# Patient Record
Sex: Male | Born: 1945 | Race: White | Hispanic: No | Marital: Married
Health system: Midwestern US, Community
[De-identification: ages and names within clinical notes are randomized; demographics above are authoritative.]

## PROBLEM LIST (undated history)

## (undated) DIAGNOSIS — N400 Enlarged prostate without lower urinary tract symptoms: Secondary | ICD-10-CM

## (undated) DIAGNOSIS — Z006 Encounter for examination for normal comparison and control in clinical research program: Secondary | ICD-10-CM

## (undated) DIAGNOSIS — N401 Enlarged prostate with lower urinary tract symptoms: Secondary | ICD-10-CM

## (undated) DIAGNOSIS — C61 Malignant neoplasm of prostate: Secondary | ICD-10-CM

## (undated) DIAGNOSIS — R001 Bradycardia, unspecified: Secondary | ICD-10-CM

## (undated) DIAGNOSIS — K52831 Collagenous colitis: Secondary | ICD-10-CM

## (undated) DIAGNOSIS — G4700 Insomnia, unspecified: Secondary | ICD-10-CM

## (undated) DIAGNOSIS — I10 Essential (primary) hypertension: Secondary | ICD-10-CM

## (undated) DIAGNOSIS — T8859XA Other complications of anesthesia, initial encounter: Secondary | ICD-10-CM

## (undated) HISTORY — DX: Essential (primary) hypertension: I10

## (undated) HISTORY — DX: Insomnia, unspecified: G47.00

## (undated) HISTORY — DX: Collagenous colitis: K52.831

## (undated) HISTORY — DX: Bradycardia, unspecified: R00.1

## (undated) HISTORY — DX: Malignant neoplasm of prostate: C61

## (undated) HISTORY — PX: KNEE ARTHROSCOPY: SUR90

## (undated) HISTORY — PX: SHOULDER ARTHROSCOPY: SHX128

## (undated) HISTORY — PX: HIP SURGERY: SHX245

---

## 2011-11-23 LAB — VITAMIN B12: Vitamin B-12: 637 ng/L (ref 211–911)

## 2011-11-23 LAB — TESTOSTERONE, FREE
Sex Hormone Binding: 49 nmol/L (ref 11–80)
Testosterone, Free: 79 pg/mL (ref 47–244)
Testosterone: 489 ng/dL

## 2011-12-23 LAB — SURGICAL PATHOLOGY

## 2012-06-16 LAB — PSA, DIAGNOSTIC: PSA: 3.58 ug/L (ref 0.13–4.00)

## 2012-06-23 NOTE — Progress Notes (Signed)
PSA 3.6  Last 3.9    No sxs    Exam:  2+ soft, smooth.    PSA 6    This office note has been dictated.

## 2012-07-24 MED ORDER — VALACYCLOVIR HCL 1 G PO TABS
1 g | ORAL_TABLET | Freq: Two times a day (BID) | ORAL | Status: AC
Start: 2012-07-24 — End: 2012-07-25

## 2012-07-24 MED ORDER — CHLORHEXIDINE GLUCONATE 0.12 % MT SOLN
0.12 % | Freq: Two times a day (BID) | OROMUCOSAL | Status: AC
Start: 2012-07-24 — End: 2012-08-07

## 2012-07-24 NOTE — Progress Notes (Signed)
Subjective:      Patient ID: Jay Lewis is a 66 y.o. male.    Oral Pain   Pertinent negatives include no sinus pressure.    acute visit for tongue pain, lesion starting about 2 days after dental extraction about a week  Ago.  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    Review of Systems   Constitutional: Negative.    HENT: Positive for mouth sores. Negative for sore throat, rhinorrhea, sneezing, trouble swallowing, neck pain, postnasal drip and sinus pressure.    Eyes: Negative.    Respiratory: Negative.    Cardiovascular: Negative.    Gastrointestinal: Negative.    Musculoskeletal: Negative.    Skin: Negative.    Allergic/Immunologic: Negative.    Neurological: Negative.    Hematological: Negative.    Psychiatric/Behavioral: Negative.        Objective:   Physical Exam   Constitutional: He is oriented to person, place, and time. He appears well-developed and well-nourished. No distress.   HENT:   Head: Normocephalic and atraumatic.   Right Ear: Right ear foreign body: cerumen plug.   Left Ear: External ear normal.   Nose: Nose normal. No mucosal edema or rhinorrhea.   Mouth/Throat: Uvula is midline. Oral lesions present.       Eyes: Conjunctivae are normal. Pupils are equal, round, and reactive to light.   Neck: Neck supple. No thyromegaly present.   Cardiovascular: Normal rate, regular rhythm and intact distal pulses.    No murmur heard.  Pulmonary/Chest: Effort normal and breath sounds normal. No respiratory distress. He has no wheezes.   Abdominal: Bowel sounds are normal. He exhibits no distension. There is no tenderness.   Musculoskeletal: Normal range of motion. He exhibits no edema.   Lymphadenopathy:     He has no cervical adenopathy.   Neurological: He is alert and oriented to person, place, and time.   Skin: Skin is dry.   Psychiatric: He has a normal mood and affect. His behavior is normal.     BP 164/98   Pulse 80   Wt 223 lb 6.4 oz (101.334 kg)   BMI 30.09 kg/m2    Assessment:      aphthous ulcers-viral       Plan:      Valtrex 2gm q 12hr x 1 day  peridex oral rinse prn  F/u prn

## 2012-11-30 MED ORDER — AMITRIPTYLINE HCL 50 MG PO TABS
50 MG | ORAL_TABLET | ORAL | Status: DC
Start: 2012-11-30 — End: 2013-11-23

## 2012-11-30 NOTE — Telephone Encounter (Signed)
Not seen in [place] since [facility]

## 2012-12-01 NOTE — Telephone Encounter (Signed)
LM to call.

## 2012-12-17 DIAGNOSIS — N400 Enlarged prostate without lower urinary tract symptoms: Secondary | ICD-10-CM | POA: Insufficient documentation

## 2012-12-17 LAB — MICROSCOPIC URINALYSIS
Epithelial Cells UA: 0 /HPF (ref 0–4)
RBC, UA: 0 /HPF (ref 0–4)
WBC, UA: 0 /HPF (ref 0–4)

## 2012-12-17 LAB — UA W/REFLEX CULTURE
Bilirubin Urine: NEGATIVE
Glucose, Ur: NEGATIVE
Ketones, Urine: NEGATIVE
Nitrite, Urine: NEGATIVE
Protein, UA: NEGATIVE
Specific Gravity, UA: 1.01 (ref 1.010–1.025)
Urine Hgb: NEGATIVE
Urobilinogen, Urine: NORMAL
pH, UA: 7 — ABNORMAL HIGH (ref 5.0–6.0)

## 2012-12-17 LAB — CBC WITH AUTO DIFFERENTIAL
Absolute Eos #: 0.7 10*3/uL — ABNORMAL HIGH (ref 0.0–0.4)
Absolute Lymph #: 2.1 10*3/uL (ref 1.0–4.8)
Absolute Mono #: 0.7 10*3/uL (ref 0.1–1.2)
Basophils Absolute: 0.1 10*3/uL (ref 0.0–0.2)
Basophils: 1 % (ref 0–2)
Eosinophils %: 9 % — ABNORMAL HIGH (ref 1–4)
Hematocrit: 43.2 % (ref 41–53)
Hemoglobin: 14.4 g/dL (ref 13.5–17.5)
Lymphocytes: 27 % (ref 24–44)
MCH: 30.2 pg (ref 26–34)
MCHC: 33.3 g/dL (ref 31–37)
MCV: 90.6 fL (ref 80–100)
MPV: 9.1 fL (ref 6.0–12.0)
Monocytes: 9 % — ABNORMAL HIGH (ref 1–7)
Platelets: 183 10*3/uL (ref 140–450)
RBC: 4.77 m/uL (ref 4.5–5.9)
RDW: 13.2 % (ref 11.0–14.5)
Seg Neutrophils: 54 % (ref 36–66)
Segs Absolute: 4.1 10*3/uL (ref 1.8–7.7)
WBC: 7.7 10*3/uL (ref 3.5–11.0)

## 2012-12-17 LAB — BASIC METABOLIC PANEL
BUN: 18 mg/dL (ref 8–23)
Bun/Cre Ratio: 17 (ref 9–20)
CO2: 28 mmol/L (ref 20–31)
Calcium: 9.3 mg/dL (ref 8.6–10.0)
Chloride: 99 mmol/L (ref 98–107)
Creatinine: 1.05 mg/dL (ref 0.70–1.20)
GFR African American: >60 mL/min (ref >60)
GFR Non-African American: >60 mL/min (ref >60)
Glucose: 96 mg/dL (ref 70–99)
Potassium: 4.4 mmol/L (ref 3.5–5.1)
Sodium: 135 mmol/L — ABNORMAL LOW (ref 136–145)

## 2012-12-17 MED ORDER — LISINOPRIL 10 MG PO TABS
10 MG | ORAL_TABLET | Freq: Every day | ORAL | Status: DC
Start: 2012-12-17 — End: 2015-09-15

## 2012-12-17 NOTE — Progress Notes (Signed)
Subjective:      Patient ID: Jay Lewis is a 67 y.o. male.    HPI Comments: Needs refill of elavil for sleep problems.  Has seen doctor walsh for prostate issues.  Blood pressure has monitored at home and consistently at 140.  Has been exercising three days a week.  Does have knee pain when walks is going to be biking.  Tremor unchanged.      Review of Systems   Constitutional: Negative.    HENT: Negative.    Eyes: Negative.    Respiratory: Negative.    Cardiovascular: Negative.    Gastrointestinal: Negative.    Endocrine: Negative.    Genitourinary: Positive for decreased urine volume.   Musculoskeletal: Positive for arthralgias.   Skin: Negative.         Cracked skin on hands   Allergic/Immunologic: Negative.    Neurological: Positive for tremors.   Hematological: Negative.    Psychiatric/Behavioral: Negative.        Objective:   Physical Exam   Nursing note and vitals reviewed.  Constitutional: He is oriented to person, place, and time. He appears well-developed and well-nourished. No distress.   HENT:   Head: Normocephalic and atraumatic.   Right Ear: External ear normal.   Left Ear: External ear normal.   Nose: Nose normal.   Mouth/Throat: Oropharynx is clear and moist. No oropharyngeal exudate.   Eyes: Conjunctivae are normal. Pupils are equal, round, and reactive to light. Right eye exhibits no discharge. Left eye exhibits no discharge. No scleral icterus.   Neck: No tracheal deviation present. No thyromegaly present.   Cardiovascular: Normal rate, regular rhythm, normal heart sounds and intact distal pulses.    No murmur heard.  Pulmonary/Chest: Effort normal and breath sounds normal. He has no wheezes. He has no rales.   Abdominal: Soft. Bowel sounds are normal. He exhibits no mass. There is no tenderness.   Musculoskeletal: He exhibits no edema.   No joint effusions   Lymphadenopathy:     He has no cervical adenopathy.   Neurological: He is alert and oriented to person, place, and time. He displays  tremor (slight tremor). No cranial nerve deficit.   No cog wheeling    Skin: Skin is warm and dry. He is not diaphoretic.   Psychiatric: He has a normal mood and affect.     Thickened skin on index finger with scaliness  Assessment:      Hypertension  BPH  Lesion finger  Insomnia  Tremor  Knee pain and arthritis  Hyperlipidemia          Plan:      Will start lisinopril 10 mg a day with typical orders. Discussed tetanus and zostivax to look into.  Labs per orders.  Continue fitness and diet.  Does not want to pursue cholesterol issues.  Follow up prn and per routine health care needs. To have blood pressure monitored at home if above 135/85 to recheck

## 2012-12-17 NOTE — Patient Instructions (Signed)
Look into tetanus and shingle shot    Keep active and improve diet with weight loss

## 2012-12-18 NOTE — Telephone Encounter (Signed)
Lm to call re 4/3 labs

## 2012-12-18 NOTE — Telephone Encounter (Signed)
Message copied by Olena Leatherwood on Fri Dec 18, 2012  8:52 AM  ------       Message from: Leonie Man       Created: Thu Dec 17, 2012  2:42 PM         Labs look good  ------

## 2012-12-18 NOTE — Telephone Encounter (Signed)
Message copied by Olena Leatherwood on Fri Dec 18, 2012  8:56 AM  ------       Message from: Leonie Man       Created: Thu Dec 17, 2012  2:42 PM         Labs look good  ------

## 2012-12-22 LAB — PSA, DIAGNOSTIC: PSA: 3.26 ug/L (ref <4.1)

## 2012-12-22 NOTE — Progress Notes (Signed)
Just got PSA now.    Last 3.6    Exam: 2+ soft, smooth    PSA 6 unless current much higher    This office note has been dictated.

## 2013-01-05 NOTE — Telephone Encounter (Signed)
Patient was called again today/he had already viewed his labs in his my chart when they were available to him

## 2013-02-25 MED ORDER — TERBINAFINE HCL 250 MG PO TABS
250 MG | ORAL_TABLET | Freq: Every day | ORAL | Status: AC
Start: 2013-02-25 — End: 2013-03-27

## 2013-02-25 MED ORDER — LOSARTAN POTASSIUM 50 MG PO TABS
50 MG | ORAL_TABLET | Freq: Every day | ORAL | Status: DC
Start: 2013-02-25 — End: 2015-09-15

## 2013-02-25 MED ORDER — CLOBETASOL PROPIONATE 0.05 % EX OINT
0.05 % | CUTANEOUS | Status: DC
Start: 2013-02-25 — End: 2015-09-15

## 2013-02-25 NOTE — Patient Instructions (Addendum)
Stop lisinopril   lamisil cream  Toenail Fungus: After Your Visit  Your Care Instructions  A toenail that is infected by a fungus usually turns white or yellow. As the fungus spreads, the nail turns a darker color and gets thicker, and its edges start to turn ragged and crumble. A bad infection can cause toe pain, and the nail may pull away from the toe.  Toenails that are exposed to moisture and warmth a lot are more likely to get infected by a fungus. This can happen from wearing sweaty shoes often and from walking barefoot on shower floors.  It is hard to treat toenail fungus, and the infection can return after it has cleared up. But medicines can sometimes get rid of toenail fungus for good. If the infection is very bad, or if it causes a lot of pain, you may need to have the nail removed.  Follow-up care is a key part of your treatment and safety. Be sure to make and go to all appointments, and call your doctor if you are having problems. It???s also a good idea to know your test results and keep a list of the medicines you take.  How can you care for yourself at home?  ?? Take your medicines exactly as prescribed. Call your doctor if you have any problems with your medicine. You will get more details on the specific medicines your doctor prescribes.  ?? If your doctor gave you a cream or liquid to put on your toenail, use it exactly as directed.  ?? Wash your feet often, and wash your hands after touching your feet.  ?? Keep your toenails clean and dry. Dry your feet completely after you bathe and before you put on shoes and socks.  ?? Keep your toenails trimmed.  ?? Change socks often. Wear dry socks that absorb moisture.  ?? Do not go barefoot in public places.  ?? Use a spray or powder that fights fungus on your feet and in your shoes.  ?? Do not pick at the skin around your nails.  ?? Do not use nail polish or fake nails on your toenails.  When should you call for help?  Call your doctor now or seek immediate medical  care if:  ?? You have signs of infection, such as:  ?? Increased pain, swelling, warmth, or redness.  ?? Red streaks leading from the site.  ?? Pus draining from the site.  ?? A fever.  ?? You have new or increased toe pain.  Watch closely for changes in your health, and be sure to contact your doctor if:  ?? You do not get better as expected.   Where can you learn more?   Go to [email or web address] and sign in to your MyChart account. Enter D202 in the Search Health Information box to learn more about ???Toenail Fungus: After Your Visit.???    If you do not have an account, please click on the ???Sign Up Now??? link.     ?? 2006-2014 [employer]. Care instructions adapted under license by [facility]. This care instruction is for use with your licensed healthcare professional. If you have questions about a medical condition or this instruction, always ask your healthcare professional. [employer] disclaims any warranty or liability for your use of this information.  Content Version: 10.0.273164; Last Revised: July 31, 2012

## 2013-02-25 NOTE — Progress Notes (Signed)
Subjective:      Patient ID: Jay Lewis is a 67 y.o. male.    HPI Comments: Has rash on feet that did not respond to over counter antifungal.  Does have mycotic nails  Cough started after lisinopril started with classical presentation to ace induced cough    Cough  Pertinent negatives include no shortness of breath.       Review of Systems   HENT: Negative.    Eyes: Negative.    Respiratory: Positive for cough. Negative for shortness of breath.    Cardiovascular: Negative.    Gastrointestinal: Negative.        Objective:   Physical Exam   Nursing note and vitals reviewed.  Constitutional: He is oriented to person, place, and time. He appears well-developed and well-nourished.   Cardiovascular: Normal rate and regular rhythm.    Pulmonary/Chest: Effort normal and breath sounds normal.   Abdominal: Soft.   Musculoskeletal: He exhibits no edema.   Neurological: He is alert and oriented to person, place, and time.   Skin:   Has mycotic nails and tinea pedis  No evidence of bacterial infection       Assessment:      Tinea infection feet with mycotic nails  Cough ace induced  Hypertension        Plan:      Will stop lisinopril and start cozaar 50 mg a day.  Discussed option and care of fungus feet.  Will start lamisil and topical with prn steroid cream.

## 2013-06-22 LAB — PSA, DIAGNOSTIC: PSA: 4 ug/L (ref <4.1)

## 2013-06-25 NOTE — Progress Notes (Signed)
No sxs    PSA 4.0  Last 3.6  Range 3-4    Exam:  Prostate 2+ soft, smooth.    PSA 6    This office note has been dictated.

## 2013-11-23 MED ORDER — AMITRIPTYLINE HCL 50 MG PO TABS
50 MG | ORAL_TABLET | Freq: Every evening | ORAL | Status: AC
Start: 2013-11-23 — End: ?

## 2013-11-23 NOTE — Telephone Encounter (Signed)
02/25/13 last OV

## 2013-12-17 LAB — PSA, DIAGNOSTIC: PSA: 3.15 ug/L (ref <4.1)

## 2014-01-14 NOTE — Progress Notes (Signed)
No sxs    PSA 3.2  Last 4    Range 3-4    Exam:  2+ soft, smooth    PSA 1 year    This office note has been dictated.

## 2015-01-13 ENCOUNTER — Encounter: Admit: 2015-01-13 | Discharge: 2015-01-13 | Payer: MEDICARE | Primary: Internal Medicine

## 2015-01-13 DIAGNOSIS — N400 Enlarged prostate without lower urinary tract symptoms: Secondary | ICD-10-CM

## 2015-01-13 LAB — PSA, DIAGNOSTIC: PSA: 3.19 ug/L (ref <4.1)

## 2015-01-20 ENCOUNTER — Ambulatory Visit: Admit: 2015-01-20 | Discharge: 2015-01-20 | Payer: MEDICARE | Attending: Urology | Primary: Internal Medicine

## 2015-01-20 DIAGNOSIS — Z125 Encounter for screening for malignant neoplasm of prostate: Secondary | ICD-10-CM

## 2015-01-20 NOTE — Progress Notes (Signed)
PSA 3.2 Last 3.6    No sxs    Exam: 2+ soft, smooth.    PSA 6    This office note has been dictated.

## 2015-09-15 ENCOUNTER — Ambulatory Visit: Admit: 2015-09-15 | Payer: MEDICARE | Primary: Internal Medicine

## 2015-09-15 ENCOUNTER — Ambulatory Visit: Admit: 2015-09-15 | Discharge: 2015-09-15 | Payer: MEDICARE | Attending: Family | Primary: Internal Medicine

## 2015-09-15 DIAGNOSIS — M25511 Pain in right shoulder: Secondary | ICD-10-CM

## 2015-09-15 MED ORDER — HYDROCODONE-ACETAMINOPHEN 5-325 MG PO TABS
5-325 MG | ORAL_TABLET | Freq: Three times a day (TID) | ORAL | 0 refills | Status: AC | PRN
Start: 2015-09-15 — End: 2015-09-22

## 2015-09-15 NOTE — Progress Notes (Signed)
Subjective:      Patient ID: Jay Lewis is a 69 y.o. male.    Shoulder Pain    The pain is present in the right shoulder. This is a new problem. The current episode started yesterday. There has been a history of trauma. The problem occurs constantly. The problem has been unchanged. The quality of the pain is described as aching. The pain is at a severity of 7/10. The pain is moderate. Associated symptoms include a limited range of motion and stiffness. Pertinent negatives include no fever, itching, joint locking, joint swelling, numbness or tingling. The symptoms are aggravated by activity. He has tried acetaminophen, NSAIDS, heat and cold for the symptoms. The treatment provided mild relief.       Review of Systems   Constitutional: Negative for activity change, appetite change and fever.   Respiratory: Negative for cough, shortness of breath and wheezing.    Cardiovascular: Negative for chest pain and palpitations.   Musculoskeletal: Positive for stiffness.        Right shoulder pain   Skin: Negative.  Negative for itching.   Neurological: Negative for tingling and numbness.       Objective:   Physical Exam   Constitutional: He is oriented to person, place, and time. He appears well-developed and well-nourished. No distress.   HENT:   Head: Normocephalic and atraumatic.   Eyes: Pupils are equal, round, and reactive to light.   Neck: Neck supple.   Cardiovascular: Normal rate, regular rhythm and normal heart sounds.    Pulmonary/Chest: Effort normal and breath sounds normal.   Musculoskeletal:        Right shoulder: He exhibits decreased range of motion, tenderness, pain and decreased strength. He exhibits normal pulse.        Arms:  Positive speed's test, positive hawkins test.    Neurological: He is alert and oriented to person, place, and time.   Nursing note and vitals reviewed.    Xr Shoulder Right Standard    Result Date: 09/15/2015  EXAMINATION: 3 VIEWS OF THE RIGHT SHOULDER 09/15/2015 2:09 pm  COMPARISON: None. 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  Mild osteoarthritis of the glenohumeral joint with mild narrowing of the joint space.  No acute fracture, dislocation or focal osseous abnormality.  No soft tissue calcifications.     Osteoarthritis.       Assessment:      1. Acute pain of right shoulder  XR Shoulder Right Standard    External Referral To Orthopedic Surgery   2. Fall, initial encounter             Plan:         discussed xray results with patient   Concern for rotator injury  Will refer to Dr. Mel Almond for evaluation  Will give patient norco 5-325mg  1 tablet every 8 hours as needed  Pt to return if symptoms do not improve or worsen   Return PRN    Controlled Substances Monitoring: Attestation: The Prescription Monitoring Report for this patient was reviewed today. Sumner Boast, NP)  Documentation: No signs of potential drug abuse or diversion identified. Sumner Boast, NP)

## 2015-09-19 ENCOUNTER — Ambulatory Visit: Admit: 2015-09-19 | Discharge: 2015-09-19 | Payer: MEDICARE | Attending: Surgical | Primary: Internal Medicine

## 2015-09-19 DIAGNOSIS — M75101 Unspecified rotator cuff tear or rupture of right shoulder, not specified as traumatic: Secondary | ICD-10-CM

## 2015-09-19 NOTE — Progress Notes (Signed)
Please see dictation

## 2015-09-22 NOTE — Telephone Encounter (Signed)
Was seen for rotator cuff and forgot to ask for more pain meds when he was here, is now out of Norco 5-325mg . Told patient we could check on Monday to see if can get this filled and pt stated understanding. Also told him he could check with his family doctor as well to see if they could prescribe more until we can check on Monday. He said okay.

## 2015-09-26 NOTE — Telephone Encounter (Signed)
Per Gustavus Bryant, we can call in Naprosyn 375mg  BID- Patient stated that he cannot take any NSAIDS due to colitis. Aaron Edelman does not feel comfortable prescribing narcotics for long term- patient stated that he will discuss narcotics with PCP.

## 2015-10-18 ENCOUNTER — Ambulatory Visit: Admit: 2015-10-18 | Discharge: 2015-10-18 | Payer: MEDICARE | Attending: Urology | Primary: Internal Medicine

## 2015-10-18 DIAGNOSIS — R3915 Urgency of urination: Secondary | ICD-10-CM

## 2015-10-18 LAB — POCT URINALYSIS DIPSTICK W/O MICROSCOPE (AUTO)
Bilirubin, UA: NEGATIVE
Blood, UA POC: NEGATIVE
Glucose, UA POC: NEGATIVE
Ketones, UA: NEGATIVE
Leukocytes, UA: NEGATIVE
Nitrite, UA: NEGATIVE
Protein, UA POC: NEGATIVE
Spec Grav, UA: >=1.03
Urobilinogen, UA: 0.2
pH, UA: 5

## 2015-10-18 MED ORDER — SILDENAFIL CITRATE 100 MG PO TABS
100 | ORAL_TABLET | ORAL | 3 refills | Status: AC | PRN
Start: 2015-10-18 — End: ?

## 2015-10-18 NOTE — Patient Instructions (Signed)
UROFLOW INSTRUCTIONS    1. Drink at least eight (8) glasses containing eight (8) ounces of fluid before  appointment.    2. DO NOT URINATE    3. When arriving at clinic, let receptionist know that you are here.    4. If you cannot hold your urine, please inform the receptionist and we will put you  in a room at once.    5. You will be urinating into a machine at the time of the uroflow.

## 2015-10-18 NOTE — Progress Notes (Signed)
HPI:    Erectile Dysfunction:  Patient is here today for erectile dysfunction which is intermitet  Recently his ED symptoms: somewhat worseHis erections are weak  Difficulty maintaining erection? no  Current medical Rx for ED: none  Previous treatments tried for ED: none  Penile curvature: no  Premature Ejaculation? No  Sex drive/libido: normal  Previous testosterone level? not examined     Urgency  Patient is here today forurgency which started 6 week(s) ago  Symptom onset was sudden  Severity of dysuria described as moderate  The course of symptoms over time is episodic. Daytime only  Alleviating factors: none  Worsening factors: none  Current medical Rx for dysuria: none    Lower urinary tract symptoms: urgency  Recurrent urinary tract infections? no  HX borderline PSA    Due again in May              Patient is a 70 y.o. male in no acute distress.  He is alert and oriented to person, place, and time.      ROS:  CONSTITUTIONAL: negative for chills,fatigue,fever or weight loss.  EYES: no reported visual changes.  CARDIOVASCULAR: negative for chest pain,palpitations, tachycardia or edema.  RESPIRATORY: negative for cough or SOB.  GI:The patient denies abdominal or flank pain, anorexia, nausea or vomiting, change in          bowel habits or rectal bleeding.  GU: see HPI.  MUSCULOSKELETAL: Patient denies low back pain or painful or reduced range of                                                 motion of the back.  NEUROLOGICAL: The patient denies any symptoms of neurological impairment or                                         TIA's; no history of stroke.  LYMPHATIC: has noted no swollen glands in neck, axillary or inguinal areas.  PSYCHIATRIC: No anxiety or depression.  SKIN: No rash  The remainder of the complete ROS is negative.    PHYSICAL EXAM:        CONSTITUTIONAL: General appearance is a well nourished, well developed male in no distress.        EYES:. Pupils are equal, round and react to light and  accommodation.  Inspection of the lids and conjunctiva are normal.        NOSE: The external appearance of the nose is normal        EARS:  The patient has adequate hearing, and the ears appear normal to external inspection.        NECK:  The trachea is midline                MUSCULOSKELETAL: normal gait        NEUROLOGIC: There is normal sensation to touch        LYMPHATICS: I do not appreciate any lymphadenopathy        SKIN: there are no obvious rashes noted        PSYCHIATRIC:  The patient is oriented in all spheres and has good insight and judgement as related to his current health issues.  He has normal mood and affect.  Past Medical History   Diagnosis Date   ??? Actinic keratosis    ??? Anemia, mild    ??? Anxiety    ??? Chondromalacia    ??? Collagenous colitis    ??? Depression    ??? Diverticulosis    ??? Elevated blood pressure    ??? GERD (gastroesophageal reflux disease)    ??? Head trauma    ??? Hyperlipidemia    ??? Insomnia    ??? Osteoarthritis    ??? Right hip pain    ??? Tachycardia      history of   ??? Tremor      with right-sided weakness     Past Surgical History   Procedure Laterality Date   ??? Finger surgery       thumb   ??? Colonoscopy  05/24/2011     diverticulosis/hemorrhoid   ??? Prostate surgery  12/17/2011     biopsy   ??? Colonoscopy  2016     Colitis     Family History   Problem Relation Age of Onset   ??? Stroke Father    ??? Heart Disease Father      Current Outpatient Prescriptions on File Prior to Visit   Medication Sig Dispense Refill   ??? Glucosamine-Chondroitin (GLUCOSAMINE CHONDR COMPLEX PO) Take by mouth daily     ??? zolpidem (AMBIEN) 10 MG tablet Take 5 mg by mouth nightly as needed     ??? amitriptyline (ELAVIL) 50 MG tablet Take 1 tablet by mouth nightly. 90 tablet 0     No current facility-administered medications on file prior to visit.       Outpatient Encounter Prescriptions as of 10/18/2015   Medication Sig Dispense Refill   ??? loperamide (LOPERAMIDE A-D) 2 MG tablet Take 2 mg by mouth 4 times daily as needed  for Diarrhea     ??? bismuth subsalicylate (PEPTO BISMOL) 262 MG chewable tablet Take 524 mg by mouth 4 times daily (before meals and nightly)     ??? acetaminophen (TYLENOL) 500 MG tablet Take 500 mg by mouth every 6 hours as needed for Pain     ??? Melatonin 10 MG CAPS Take by mouth     ??? Simethicone 125 MG CAPS Take by mouth     ??? Calcium Carbonate-Vitamin D (CALCIUM-VITAMIN D) 500-200 MG-UNIT per tablet Take 1 tablet by mouth 2 times daily (with meals)     ??? Glucosamine-Chondroitin (GLUCOSAMINE CHONDR COMPLEX PO) Take by mouth daily     ??? zolpidem (AMBIEN) 10 MG tablet Take 5 mg by mouth nightly as needed     ??? amitriptyline (ELAVIL) 50 MG tablet Take 1 tablet by mouth nightly. 90 tablet 0   ??? [DISCONTINUED] Cholecalciferol (VITAMIN D3) 3000 UNITS TABS Take 3,000 Units by mouth daily.       No facility-administered encounter medications on file as of 10/18/2015.      Review of patient's allergies indicates no known allergies.  History   Smoking Status   ??? Former Smoker   ??? Quit date: 06/23/1980   Smokeless Tobacco   ??? Never Used     History   Alcohol Use   ??? Yes     Comment: occassionally       Vitals:    10/18/15 1611   BP: (!) 170/80   Pulse: 80     PVR: 16    Plan: Flow, testo levels    rx viagra 100    This office note has been dictated.

## 2015-10-27 ENCOUNTER — Encounter: Primary: Internal Medicine

## 2015-10-30 ENCOUNTER — Encounter: Primary: Internal Medicine

## 2015-10-30 ENCOUNTER — Encounter: Admit: 2015-10-30 | Discharge: 2015-10-30 | Payer: MEDICARE | Primary: Internal Medicine

## 2015-10-30 DIAGNOSIS — R3915 Urgency of urination: Secondary | ICD-10-CM

## 2015-10-31 ENCOUNTER — Encounter: Attending: Urology | Primary: Internal Medicine

## 2015-10-31 ENCOUNTER — Encounter: Attending: Orthopaedic Surgery | Primary: Internal Medicine

## 2015-11-06 ENCOUNTER — Encounter: Attending: Orthopaedic Surgery | Primary: Internal Medicine

## 2015-11-10 ENCOUNTER — Ambulatory Visit: Admit: 2015-11-10 | Discharge: 2015-11-10 | Payer: MEDICARE | Attending: Urology | Primary: Internal Medicine

## 2015-11-10 DIAGNOSIS — N401 Enlarged prostate with lower urinary tract symptoms: Secondary | ICD-10-CM

## 2015-11-10 MED ORDER — TESTOSTERONE CYPIONATE 200 MG/ML IM SOLN
200 MG/ML | Freq: Once | INTRAMUSCULAR | Status: AC
Start: 2015-11-10 — End: ?

## 2015-11-10 MED ORDER — TESTOSTERONE 20.25 MG/1.25GM (1.62%) TD GEL
20.25 MG/1.25GM (1.62%) | Freq: Every day | TRANSDERMAL | 3 refills | Status: DC
Start: 2015-11-10 — End: 2017-06-16

## 2015-11-10 NOTE — Progress Notes (Signed)
Testo 127    Flow: 6/4/212/60    Plan: Rapaflo, depotestosterone 200 IM, then Androgel    RTC 1 month, PSA/H+H, testo levels 3 months    This office note has been dictated.

## 2015-12-01 ENCOUNTER — Ambulatory Visit: Admit: 2015-12-01 | Discharge: 2015-12-01 | Payer: MEDICARE | Attending: Urology | Primary: Internal Medicine

## 2015-12-01 DIAGNOSIS — R3915 Urgency of urination: Secondary | ICD-10-CM

## 2015-12-01 MED ORDER — TAMSULOSIN HCL 0.4 MG PO CAPS
0.4 MG | ORAL_CAPSULE | Freq: Every day | ORAL | 3 refills | Status: DC
Start: 2015-12-01 — End: 2019-06-28

## 2015-12-01 NOTE — Progress Notes (Signed)
Jay Lewis is a 70 y.o. male    CC:    Chief Complaint   Patient presents with   ??? Dysuria     Patient c/o urgency. Started on Rapaflo 3 weeks ago. Patient satisfied with results.       PCP:  Dr. Jacobo Forest      ROS:  CONSTITUTIONAL: no nausea and no vomiting  EYES: negative  CARDIOVASCULAR:none  RESPIRATORY: Negative for wheezing, shortness of breath, cough and hemoptysis  GI: Negative  GU: see HPI.  MUSCULOSKELETAL: Negative for back pain, joint pain   NEUROLOGICAL: no TIA or stroke-like symptoms, no amaurosis, diplopia, abnormal speech, unilateral numbness or weakness.  LYMPHATIC: positive for none  PSYCHIATRIC: negative  SKIN: positive for rash on face due to skin cancer treatment.      Past Medical History   Diagnosis Date   ??? Actinic keratosis    ??? Anemia, mild    ??? Anxiety    ??? Chondromalacia    ??? Collagenous colitis    ??? Depression    ??? Diverticulosis    ??? Elevated blood pressure    ??? GERD (gastroesophageal reflux disease)    ??? Head trauma    ??? Hyperlipidemia    ??? Insomnia    ??? Osteoarthritis    ??? Right hip pain    ??? Skin cancer    ??? Tachycardia      history of   ??? Tremor      with right-sided weakness     Past Surgical History   Procedure Laterality Date   ??? Finger surgery       thumb   ??? Colonoscopy  05/24/2011     diverticulosis/hemorrhoid   ??? Prostate surgery  12/17/2011     biopsy   ??? Colonoscopy  2016     Colitis     Family History   Problem Relation Age of Onset   ??? Stroke Father    ??? Heart Disease Father      History   Smoking Status   ??? Former Smoker   ??? Quit date: 06/23/1980   Smokeless Tobacco   ??? Never Used     History   Alcohol Use   ??? Yes     Comment: occassionally       Vitals:    12/01/15 0849   BP: 128/78   Pulse: 52

## 2015-12-01 NOTE — Progress Notes (Signed)
HPI:    Urgency    hypogonadism    Patient is here for urgency, hypogonadism which started: years ago  Symptoms were: urge, frequency      Alleviated factors: Rapaflo    Current medical Rx for urgency: Rapaflo    PHYSICAL EXAM:    Patient is a 70 y.o. male in no acute distress.  He is alert and oriented to person, place, and time.            CONSTITUTIONAL: General appearance is a well nourished, well developed male in no distress.        EYES:. Pupils are equal, round and react to light and accommodation.  Inspection of the lids and conjunctiva are normal.        NOSE: The external appearance of the nose is normal        EARS:  The patient has adequate hearing, and the ears appear normal to external inspection.            MUSCULOSKELETAL: normal gait        NEUROLOGIC: There is normal sensation to touch        LYMPHATICS: I do not appreciate any lymphadenopathy        SKIN: there are no obvious rashes noted        PSYCHIATRIC:  The patient is oriented in all spheres and has good insight and judgement as related to his current health issues.  He has normal mood and affect.    LABS DONE TODAY IN OFFICE:  Testosterone 193      IMPRESSION: BPH, hypogonadism    PLAN: repeat PSA if beginning ART.  Rx flomax

## 2016-01-19 ENCOUNTER — Encounter: Payer: MEDICARE | Primary: Internal Medicine

## 2016-01-22 ENCOUNTER — Encounter: Admit: 2016-01-22 | Discharge: 2016-01-22 | Payer: MEDICARE | Primary: Internal Medicine

## 2016-01-22 ENCOUNTER — Encounter: Payer: MEDICARE | Primary: Internal Medicine

## 2016-01-22 DIAGNOSIS — N401 Enlarged prostate with lower urinary tract symptoms: Secondary | ICD-10-CM

## 2016-01-22 LAB — HEMOGLOBIN AND HEMATOCRIT
Hematocrit: 40.1 % — ABNORMAL LOW (ref 41–53)
Hemoglobin: 13.3 g/dL — ABNORMAL LOW (ref 13.5–17.5)

## 2016-01-23 ENCOUNTER — Encounter

## 2016-01-31 ENCOUNTER — Ambulatory Visit: Admit: 2016-01-31 | Discharge: 2016-01-31 | Payer: MEDICARE | Attending: Urology | Primary: Internal Medicine

## 2016-01-31 DIAGNOSIS — N401 Enlarged prostate with lower urinary tract symptoms: Secondary | ICD-10-CM

## 2016-01-31 NOTE — Progress Notes (Signed)
HPI:    Hypogonadism:    Patient is here today for symptoms of low testosterone which started   The patient's las testosterone level was:   Lab Results   Component Value Date    TESTOSTERONE 489 11/22/2011     Recently his hypogonadism symptoms:   Current medical Rx for hypogonadism:   His erections are  Difficulty maintaining erection?  Sex Drive/libido:   Energy level: .    PHYSICAL EXAM:    Patient is a 70 y.o. male in no acute distress.  He is alert and oriented to person, place, and time.            CONSTITUTIONAL: General appearance is a well nourished, well developed male in no distress.        EYES:. Pupils are equal, round and react to light and accommodation.  Inspection of the lids and conjunctiva are normal.        NOSE: The external appearance of the nose is normal        EARS:  The patient has adequate hearing, and the ears appear normal to external inspection.        NECK:  The trachea is midline            RECTAL: normal anal tone.  The prostate is2+, soft and benign in consistency.  The lateral sulci are easily appreciated.  The prostate is freely moveable in the pelvis.  There are no other rectal masses.        MUSCULOSKELETAL: normal gait        NEUROLOGIC: There is normal sensation to touch        LYMPHATICS: I do not appreciate any lymphadenopathy        SKIN: there are no obvious rashes noted        PSYCHIATRIC:  The patient is oriented in all spheres and has good insight and judgement as related to his current health issues.  He has normal mood and affect.      IMPRESSION: hypogonadism, bph      PLAN: PSA, H+H 1 year

## 2016-01-31 NOTE — Progress Notes (Signed)
Jay Lewis is a 70 y.o. male    CC:    Chief Complaint   Patient presents with   ??? 1 Year Follow Up     testosterone/psa and H&H       PCP:  [name], MD      ROS:  CONSTITUTIONAL: no nausea  EYES: negative  CARDIOVASCULAR:none  RESPIRATORY: negative  GI: no  GU: see HPI.  MUSCULOSKELETAL: Denies any joint swelling, joint pain, or loss of range of motion.  NEUROLOGICAL: no TIA or stroke-like symptoms.  LYMPHATIC: negative  PSYCHIATRIC: negative  SKIN: Denies any skin rashes.      Past Medical History:   Diagnosis Date   ??? Actinic keratosis    ??? Anemia, mild    ??? Anxiety    ??? Chondromalacia    ??? Collagenous colitis    ??? Depression    ??? Diverticulosis    ??? Elevated blood pressure    ??? GERD (gastroesophageal reflux disease)    ??? Head trauma    ??? Hyperlipidemia    ??? Insomnia    ??? Osteoarthritis    ??? Right hip pain    ??? Skin cancer    ??? Tachycardia     history of   ??? Tremor     with right-sided weakness     Past Surgical History:   Procedure Laterality Date   ??? COLONOSCOPY  05/24/2011    diverticulosis/hemorrhoid   ??? COLONOSCOPY  2016    Colitis   ??? FINGER SURGERY      thumb   ??? PROSTATE SURGERY  12/17/2011    biopsy     Family History   Problem Relation Age of Onset   ??? Stroke Father    ??? Heart Disease Father      History   Smoking Status   ??? Former Smoker   ??? Quit date: 06/23/1980   Smokeless Tobacco   ??? Never Used     History   Alcohol Use   ??? Yes     Comment: occassionally       Vitals:    01/31/16 1422   BP: 130/78   Pulse: 64

## 2016-01-31 NOTE — Progress Notes (Signed)
HPI:    Hypogonadism:    Patient is here today for symptoms of low testosterone which started   The patient's las testosterone level was:   Lab Results   Component Value Date    TESTOSTERONE 489 11/22/2011     Recently his hypogonadism symptoms: stable. Improved on ART  Current medical Rx for hypogonadism: ART    Hb 13.1    PSA 3.1 (stable) Free 16%

## 2016-02-09 ENCOUNTER — Encounter: Primary: Internal Medicine

## 2016-02-16 ENCOUNTER — Encounter: Attending: Urology | Primary: Internal Medicine

## 2017-01-20 ENCOUNTER — Encounter

## 2017-01-20 ENCOUNTER — Encounter: Primary: Internal Medicine

## 2017-01-31 ENCOUNTER — Encounter: Attending: Urology | Primary: Internal Medicine

## 2017-03-03 ENCOUNTER — Encounter

## 2017-03-12 ENCOUNTER — Encounter: Attending: Urology | Primary: Internal Medicine

## 2017-04-07 ENCOUNTER — Ambulatory Visit: Admit: 2017-04-07 | Discharge: 2017-04-07 | Payer: MEDICARE | Attending: Urology | Primary: Internal Medicine

## 2017-04-07 DIAGNOSIS — R350 Frequency of micturition: Secondary | ICD-10-CM

## 2017-04-07 MED ORDER — TAMSULOSIN HCL 0.4 MG PO CAPS
0.4 MG | ORAL_CAPSULE | Freq: Every day | ORAL | 11 refills | Status: DC
Start: 2017-04-07 — End: 2020-10-16

## 2017-04-07 NOTE — Progress Notes (Signed)
[name], MD        [address]  [place]  [place] [address]  Dept: [phone]  Dept Fax: [phone]  Loc: [facility] Office Note - New Patient    Patient:  Jay Lewis  Date of Birth: 08-Nov-1945  Date: 04/07/2017    The patient is a 71 y.o. male who presents today for evaluation of the following problems:  BPH  Chief Complaint   Patient presents with   . Benign Prostatic Hypertrophy     former [facility] pt    referred/consultation requested by [name], MD.    HISTORY OF PRESENT ILLNESS:     Onset was   Years ago  Overall, the problem(s) are  stable  Severity is described as mild.   Associated Symptoms: No dysuria, no gross hematuria.  Current Pain Severity: 0     Urgency- sometimes bothersome  PSA hasn't been checked in two years  Happy with  Urinary stream and voiding     on AndroGel per previous urologist      Requested/reviewed records from [name], MD office and/or outside physician/EMR    (Patient's old records have been requested, reviewed and pertinent findings summarized in today's note.)    Procedures Today: N/A      Last several PSA's:  Lab Results   Component Value Date    PSA 3.19 01/13/2015    PSA 3.15 12/17/2013    PSA 4.00 06/22/2013       Last total testosterone:  Lab Results   Component Value Date    TESTOSTERONE 489 11/22/2011       Urinalysis today:  No results found for this visit on 04/07/17.    Last BUN and creatinine:  Lab Results   Component Value Date    BUN 18 12/17/2012     Lab Results   Component Value Date    CREATININE 1.05 12/17/2012       Additional Lab/Culture results: none    Imaging Reviewed during this Office Visit:   Lilliona Blakeney ALI [name], MD independently reviewed the images and verified the radiology reports from:    No results found.    PAST MEDICAL, FAMILY AND SOCIAL HISTORY:  Past Medical History:   Diagnosis Date   . Actinic keratosis    . Anemia, mild    . Anxiety    .  Chondromalacia    . Collagenous colitis    . Depression    . Diverticulosis    . Elevated blood pressure    . GERD (gastroesophageal reflux disease)    . Head trauma    . Hyperlipidemia    . Insomnia    . Osteoarthritis    . Right hip pain    . Skin cancer    . Tachycardia     history of   . Tremor     with right-sided weakness     Past Surgical History:   Procedure Laterality Date   . COLONOSCOPY  05/24/2011    diverticulosis/hemorrhoid   . COLONOSCOPY  2016    Colitis   . FINGER SURGERY      thumb   . PROSTATE SURGERY  12/17/2011    biopsy     Family History   Problem Relation Age of Onset   . Stroke Father    . Heart Disease Father      Outpatient Prescriptions Marked as  Taking for the 04/07/17 encounter (Office Visit) with Kennith Maes, MD   Medication Sig Dispense Refill   . tamsulosin (FLOMAX) 0.4 MG capsule Take 1 capsule by mouth daily 30 capsule 11   . losartan (COZAAR) 50 MG tablet Take 50 mg by mouth daily     . tamsulosin (FLOMAX) 0.4 MG capsule Take 1 capsule by mouth daily 90 capsule 3   . Testosterone (ANDROGEL) 20.25 MG/1.25GM (1.62%) GEL Place 2 applicators onto the skin daily Apply 2 pumps daily 1.25 g 3   . loperamide (LOPERAMIDE A-D) 2 MG tablet Take 2 mg by mouth 4 times daily as needed for Diarrhea     . bismuth subsalicylate (PEPTO BISMOL) 262 MG chewable tablet Take 524 mg by mouth 4 times daily (before meals and nightly)     . acetaminophen (TYLENOL) 500 MG tablet Take 500 mg by mouth every 6 hours as needed for Pain     . Melatonin 10 MG CAPS Take by mouth     . Simethicone 125 MG CAPS Take by mouth     . Calcium Carbonate-Vitamin D (CALCIUM-VITAMIN D) 500-200 MG-UNIT per tablet Take 1 tablet by mouth 2 times daily (with meals)     . Glucosamine-Chondroitin (GLUCOSAMINE CHONDR COMPLEX PO) Take by mouth daily     . zolpidem (AMBIEN) 10 MG tablet Take 5 mg by mouth nightly as needed     . amitriptyline (ELAVIL) 50 MG tablet Take 1 tablet by mouth nightly. 90 tablet 0       Patient has no known  allergies.  History   Smoking Status   . Former Smoker   . Quit date: 06/23/1980   Smokeless Tobacco   . Never Used      (If patient a smoker, smoking cessation counseling offered)   History   Alcohol Use   . Yes     Comment: occassionally       REVIEW OF SYSTEMS:  Constitutional: negative  Eyes: negative  Respiratory: negative  Cardiovascular: negative  Gastrointestinal: negative  Genitourinary: see HPI  Musculoskeletal: negative  Skin: negative   Neurological: negative  Hematological/Lymphatic: negative  Psychological: negative      Physical Exam:    This a 70 y.o. male  Vitals:    04/07/17 0854   BP: 134/70   Pulse: 55   SpO2: 97%     Body mass index is 28.64 kg/m.  Constitutional: Patient in no acute distress;   Neuro: alert and oriented to person place and time.    Psych: Mood and affect normal.  Skin: warm, dry  Lungs: Respiratory effort normal, Symmetric chest rise  Cardiovascular:  Normal peripheral pulses; Regular rate, radial pulses palpable  Abdomen: Soft, non-tender, non-distended with no CVA, flank pain, hepatosplenomegaly or hernia.  Kidneys normal.  Bladder non-tender and not distended.  Lymphatics: no palpable lymphadenopathy  Minimal/no edema in bilateral lower extremities        Assessment and Plan        1. Benign prostatic hyperplasia with urinary frequency    2. Prostate cancer screening    3. Hypogonadism in male               Plan:        Start flomax for mild LUTS  Check PSA  Follow up in two months   needs routine blood work (CBC, PSA) if on Testosterone replacement therapy        Prescriptions Ordered:  Orders Placed This Encounter   Medications   .  tamsulosin (FLOMAX) 0.4 MG capsule     Sig: Take 1 capsule by mouth daily     Dispense:  30 capsule     Refill:  11      Orders Placed:  Orders Placed This Encounter   Procedures   . PSA, Diagnostic     Standing Status:   Future     Standing Expiration Date:   04/07/2018            [name], MD

## 2017-06-06 ENCOUNTER — Inpatient Hospital Stay: Admit: 2017-06-06 | Discharge: 2017-06-06 | Payer: MEDICARE | Primary: Internal Medicine

## 2017-06-06 DIAGNOSIS — E291 Testicular hypofunction: Secondary | ICD-10-CM

## 2017-06-06 LAB — PSA, DIAGNOSTIC: PSA: 3.88 ug/L (ref <4.1)

## 2017-06-06 LAB — HEMOGLOBIN AND HEMATOCRIT
Hematocrit: 40.6 % — ABNORMAL LOW (ref 41–53)
Hemoglobin: 13.9 g/dL (ref 13.5–17.5)

## 2017-06-09 ENCOUNTER — Encounter: Attending: Urology | Primary: Internal Medicine

## 2017-06-16 ENCOUNTER — Ambulatory Visit: Admit: 2017-06-16 | Discharge: 2017-06-16 | Payer: MEDICARE | Attending: Urology | Primary: Internal Medicine

## 2017-06-16 DIAGNOSIS — N401 Enlarged prostate with lower urinary tract symptoms: Secondary | ICD-10-CM

## 2017-06-16 NOTE — Progress Notes (Signed)
[name], MD        [place]  [place] [address]  Dept: [phone]  Dept Fax: [phone]  Loc: [phone]      [facility] Office Note - follow up Patient    Patient:  Jay Lewis  Date of Birth: 1945/12/19  Date: 06/29/2017    The patient is a 71 y.o. male who presents today for evaluation of the following problems:  BPH, PSA check  Chief Complaint   Patient presents with   . Other     6 week check flomax and PSA    referred/consultation requested by [name], MD.    HISTORY OF PRESENT ILLNESS:     Onset was   Years ago  Overall, the problem(s) are  stable  Severity is described as mild.   Associated Symptoms: No dysuria, no gross hematuria.  Current Pain Severity: 0     Happy with  Urinary stream and voiding. On flomax        Requested/reviewed records from [name], MD office and/or outside physician/EMR    (Patient's old records have been requested, reviewed and pertinent findings summarized in today's note.)    Procedures Today: N/A      Last several PSA's:  Lab Results   Component Value Date    PSA 3.88 06/06/2017    PSA 3.19 01/13/2015    PSA 3.15 12/17/2013       Last total testosterone:  Lab Results   Component Value Date    TESTOSTERONE 489 11/22/2011       Urinalysis today:  No results found for this visit on 06/16/17.    Last BUN and creatinine:  Lab Results   Component Value Date    BUN 18 12/17/2012     Lab Results   Component Value Date    CREATININE 1.05 12/17/2012       Additional Lab/Culture results: none    Imaging Reviewed during this Office Visit:   Anothy Bufano ALI [name], MD independently reviewed the images and verified the radiology reports from:    No results found.    PAST MEDICAL, FAMILY AND SOCIAL HISTORY:  Past Medical History:   Diagnosis Date   . Actinic keratosis    . Anemia, mild    . Anxiety    . Chondromalacia    . Collagenous colitis    . Depression    . Diverticulosis    . Elevated blood  pressure    . GERD (gastroesophageal reflux disease)    . Head trauma    . Hyperlipidemia    . Insomnia    . Osteoarthritis    . Right hip pain    . Skin cancer    . Tachycardia     history of   . Tremor     with right-sided weakness     Past Surgical History:   Procedure Laterality Date   . COLONOSCOPY  05/24/2011    diverticulosis/hemorrhoid   . COLONOSCOPY  2016    Colitis   . FINGER SURGERY      thumb   . PROSTATE SURGERY  12/17/2011    biopsy     Family History   Problem Relation Age of Onset   . Stroke Father    . Heart Disease Father      No outpatient prescriptions have been marked as taking for the 06/16/17 encounter (Office Visit) with Kennith Maes,  MD.       Patient has no known allergies.  History   Smoking Status   . Former Smoker   . Quit date: 06/23/1980   Smokeless Tobacco   . Never Used      (If patient a smoker, smoking cessation counseling offered)   History   Alcohol Use   . Yes     Comment: occassionally       REVIEW OF SYSTEMS:  Constitutional: negative  Eyes: negative  Respiratory: negative  Cardiovascular: negative  Gastrointestinal: negative  Genitourinary: see HPI  Musculoskeletal: negative  Skin: negative   Neurological: negative  Hematological/Lymphatic: negative  Psychological: negative      Physical Exam:    This a 71 y.o. male  Vitals:    06/16/17 0953   BP: 122/82   Pulse: 55   SpO2: 99%     Body mass index is 28.18 kg/m.  Constitutional: Patient in no acute distress;   Neuro: alert and oriented to person place and time.    Psych: Mood and affect normal.  Skin: warm, dry  Lungs: Respiratory effort normal, Symmetric chest rise  Cardiovascular:  Normal peripheral pulses; Regular rate, radial pulses palpable  Abdomen: Soft, non-tender, non-distended with no CVA, flank pain, hepatosplenomegaly or hernia.  Kidneys normal.  Bladder non-tender and not distended.  Lymphatics: no palpable lymphadenopathy  Minimal/no edema in bilateral lower extremities        Assessment and Plan        1. Benign  localized prostatic hyperplasia with lower urinary tract symptoms (LUTS)    2. Rising PSA level               Plan:      Continue flomax. flomax has been helping  PSA slight rise but in normal range. Will follow up in one year with PSA  Not on Testosterone replacement therapy        Prescriptions Ordered:  No orders of the defined types were placed in this encounter.     Orders Placed:  Orders Placed This Encounter   Procedures   . PSA, Diagnostic     Standing Status:   Future     Standing Expiration Date:   06/17/2018            [name], MD

## 2017-07-23 DIAGNOSIS — M5417 Radiculopathy, lumbosacral region: Secondary | ICD-10-CM | POA: Insufficient documentation

## 2017-07-23 DIAGNOSIS — M545 Low back pain, unspecified: Secondary | ICD-10-CM | POA: Insufficient documentation

## 2017-07-23 DIAGNOSIS — G8929 Other chronic pain: Secondary | ICD-10-CM | POA: Insufficient documentation

## 2018-06-22 ENCOUNTER — Ambulatory Visit: Admit: 2018-06-22 | Discharge: 2018-06-22 | Payer: MEDICARE | Attending: Urology | Primary: Internal Medicine

## 2018-06-22 ENCOUNTER — Inpatient Hospital Stay: Admit: 2018-06-22 | Payer: MEDICARE | Primary: Internal Medicine

## 2018-06-22 DIAGNOSIS — N401 Enlarged prostate with lower urinary tract symptoms: Secondary | ICD-10-CM

## 2018-06-22 DIAGNOSIS — R972 Elevated prostate specific antigen [PSA]: Secondary | ICD-10-CM

## 2018-06-22 LAB — PSA, DIAGNOSTIC: PSA: 3.72 ug/L (ref <4.1)

## 2018-06-22 NOTE — Progress Notes (Signed)
[name], MD        [place]  [place] [address]  Dept: [phone]  Dept Fax: [phone]  Loc: [phone]      [facility] Office Note - follow up Patient    Patient:  Jay Lewis  Date of Birth: 02-02-1946  Date: 06/22/2018    The patient is a 72 y.o. male who presents today for evaluation of the following problems:  BPH, PSA check  Chief Complaint   Patient presents with   ??? Benign Prostatic Hypertrophy     1 year with psa    referred/consultation requested by [name], MD.    HISTORY OF PRESENT ILLNESS:     BPH  Onset was   Years ago  Overall, the problem(s) are  stable  Severity is described as mild.   Associated Symptoms: No dysuria, no gross hematuria.  Current Pain Severity: 0     Happy with urinary stream and voiding. On flomax      Elevated PSA  Onset was   Years ago  Overall, the problem(s) are worsening.  Severity is described as mild.   Associated Symptoms: No dysuria, no gross hematuria.  Current Pain Severity: 0     Last PSA check one year ago. Had been rising. Will need check today.      ED  Onset was   Years ago  Overall, the problem(s) are stable.  Severity is described as mild.   Associated Symptoms: No dysuria, no gross hematuria.  Current Pain Severity: 0     Is able to get erect with sildenafil. Maintaining erection is an issue. Not bothered at this time.      Summary of Previous Records:  Pt with hx of melanoma. Getting skin treatments periodically        Requested/reviewed records from [name], MD office and/or outside physician/EMR    (Patient's old records have been requested, reviewed and pertinent findings summarized in today's note.)    Procedures Today: N/A      Last several PSA's:  Lab Results   Component Value Date    PSA 3.88 06/06/2017    PSA 3.19 01/13/2015    PSA 3.15 12/17/2013       Last total testosterone:  Lab Results   Component Value Date    TESTOSTERONE 489 11/22/2011        Urinalysis today:  No results found for this visit on 06/22/18.    Last BUN and creatinine:  Lab Results   Component Value Date    BUN 18 12/17/2012     Lab Results   Component Value Date    CREATININE 1.05 12/17/2012       Additional Lab/Culture results: none    Imaging Reviewed during this Office Visit:   Jay Lewis ALI [name], MD independently reviewed the images and verified the radiology reports from:    No results found.    PAST MEDICAL, FAMILY AND SOCIAL HISTORY:  Past Medical History:   Diagnosis Date   ??? Actinic keratosis    ??? Anemia, mild    ??? Anxiety    ??? Chondromalacia    ??? Collagenous colitis    ??? Depression    ??? Diverticulosis    ??? Elevated blood pressure    ??? GERD (gastroesophageal reflux disease)    ??? Head trauma    ??? Hyperlipidemia    ??? Insomnia    ???  Osteoarthritis    ??? Right hip pain    ??? Skin cancer    ??? Tachycardia     history of   ??? Tremor     with right-sided weakness     Past Surgical History:   Procedure Laterality Date   ??? COLONOSCOPY  05/24/2011    diverticulosis/hemorrhoid   ??? COLONOSCOPY  2016    Colitis   ??? FINGER SURGERY      thumb   ??? PROSTATE SURGERY  12/17/2011    biopsy     Family History   Problem Relation Age of Onset   ??? Stroke Father    ??? Heart Disease Father      Outpatient Medications Marked as Taking for the 06/22/18 encounter (Office Visit) with Kennith Maes, MD   Medication Sig Dispense Refill   ??? tamsulosin (FLOMAX) 0.4 MG capsule Take 1 capsule by mouth daily 30 capsule 11   ??? losartan (COZAAR) 50 MG tablet Take 50 mg by mouth daily     ??? tamsulosin (FLOMAX) 0.4 MG capsule Take 1 capsule by mouth daily 90 capsule 3   ??? loperamide (LOPERAMIDE A-D) 2 MG tablet Take 2 mg by mouth 4 times daily as needed for Diarrhea     ??? bismuth subsalicylate (PEPTO BISMOL) 262 MG chewable tablet Take 524 mg by mouth 4 times daily (before meals and nightly)     ??? acetaminophen (TYLENOL) 500 MG tablet Take 500 mg by mouth every 6 hours as needed for Pain     ??? Melatonin 10 MG CAPS Take by mouth      ??? Simethicone 125 MG CAPS Take by mouth     ??? Calcium Carbonate-Vitamin D (CALCIUM-VITAMIN D) 500-200 MG-UNIT per tablet Take 1 tablet by mouth 2 times daily (with meals)     ??? sildenafil (VIAGRA) 100 MG tablet Take 1 tablet by mouth as needed for Erectile Dysfunction 12 tablet 3   ??? Glucosamine-Chondroitin (GLUCOSAMINE CHONDR COMPLEX PO) Take by mouth daily     ??? zolpidem (AMBIEN) 10 MG tablet Take 5 mg by mouth nightly as needed     ??? amitriptyline (ELAVIL) 50 MG tablet Take 1 tablet by mouth nightly. 90 tablet 0       Patient has no known allergies.  Social History     Tobacco Use   Smoking Status Former Smoker   ??? Last attempt to quit: 06/23/1980   ??? Years since quitting: 38.0   Smokeless Tobacco Never Used      (If patient a smoker, smoking cessation counseling offered)   Social History     Substance and Sexual Activity   Alcohol Use Yes    Comment: occassionally       REVIEW OF SYSTEMS:  Constitutional: negative  Eyes: negative  Respiratory: negative  Cardiovascular: negative  Gastrointestinal: negative  Genitourinary: see HPI  Musculoskeletal: negative  Skin: negative   Neurological: negative  Hematological/Lymphatic: negative  Psychological: negative      Physical Exam:    This a 72 y.o. male  Vitals:    06/22/18 0943   BP: 120/62   Pulse: 64     Body mass index is 27.12 kg/m??.  Constitutional: Patient in no acute distress;         Assessment and Plan        1. Benign localized prostatic hyperplasia with lower urinary tract symptoms (LUTS)    2. Rising PSA level    3. Erectile dysfunction, unspecified erectile dysfunction type  Plan:      Continue flomax. flomax has been helping  PSA today  Sildenafil PRN for ED  Follow up in one year with PSA prior        Prescriptions Ordered:  No orders of the defined types were placed in this encounter.     Orders Placed:  No orders of the defined types were placed in this encounter.           Jay Kathan ALI [name], MD

## 2018-11-07 ENCOUNTER — Ambulatory Visit: Admit: 2018-11-07 | Discharge: 2018-11-07 | Payer: MEDICARE | Attending: Family Medicine | Primary: Internal Medicine

## 2018-11-07 DIAGNOSIS — K047 Periapical abscess without sinus: Secondary | ICD-10-CM

## 2018-11-07 MED ORDER — HYDROCODONE-ACETAMINOPHEN 5-325 MG PO TABS
5-325 MG | ORAL_TABLET | Freq: Four times a day (QID) | ORAL | 0 refills | Status: AC | PRN
Start: 2018-11-07 — End: 2018-11-10

## 2018-11-07 MED ORDER — AMOXICILLIN-POT CLAVULANATE 875-125 MG PO TABS
875-125 MG | ORAL_TABLET | Freq: Two times a day (BID) | ORAL | 0 refills | Status: AC
Start: 2018-11-07 — End: 2018-11-17

## 2018-11-07 NOTE — Progress Notes (Signed)
[facility]  [place] [id number]  Dept: [phone]  Dept Fax: [phone]  Loc: [phone]    Jay Lewis is a 73 y.o. male who presents today for his medical conditions/complaints as noted below.  Jay Lewis is c/o of   Chief Complaint   Patient presents with   ??? Other     tooth ache on the left side lower and upper jaw.started last evening,       HPI:     Here today for dental pain.    Dental Pain    This is a new problem. The current episode started yesterday. The problem occurs constantly. The problem has been gradually worsening. The pain is at a severity of 7/10. The pain is moderate. Pertinent negatives include no difficulty swallowing, fever, oral bleeding, sinus pressure or thermal sensitivity. Associated symptoms comments: Diaphoresis, left ear pain, no headache  . He has tried acetaminophen (chloraseptic, tramadol) for the symptoms. The treatment provided no relief.         Past Medical History:   Diagnosis Date   ??? Actinic keratosis    ??? Anemia, mild    ??? Anxiety    ??? Chondromalacia    ??? Collagenous colitis    ??? Depression    ??? Diverticulosis    ??? Elevated blood pressure    ??? GERD (gastroesophageal reflux disease)    ??? Head trauma    ??? Hyperlipidemia    ??? Insomnia    ??? Osteoarthritis    ??? Right hip pain    ??? Skin cancer    ??? Tachycardia     history of   ??? Tremor     with right-sided weakness          Social History     Tobacco Use   ??? Smoking status: Former Smoker     Last attempt to quit: 06/23/1980     Years since quitting: 38.4   ??? Smokeless tobacco: Never Used   Substance Use Topics   ??? Alcohol use: Yes     Comment: occassionally     Current Outpatient Medications   Medication Sig Dispense Refill   ??? amoxicillin-clavulanate (AUGMENTIN) 875-125 MG per tablet Take 1 tablet by mouth 2 times daily for 10 days 20 tablet 0   ??? HYDROcodone-acetaminophen (NORCO) 5-325 MG per tablet Take 1  tablet by mouth every 6 hours as needed for Pain for up to 3 days. Intended supply: 3 days. Take lowest dose possible to manage pain 12 tablet 0   ??? bismuth subsalicylate (PEPTO BISMOL) 262 MG chewable tablet Take 524 mg by mouth 4 times daily (before meals and nightly)     ??? acetaminophen (TYLENOL) 500 MG tablet Take 500 mg by mouth every 6 hours as needed for Pain     ??? sildenafil (VIAGRA) 100 MG tablet Take 1 tablet by mouth as needed for Erectile Dysfunction 12 tablet 3   ??? zolpidem (AMBIEN) 10 MG tablet Take 5 mg by mouth nightly as needed     ??? amitriptyline (ELAVIL) 50 MG tablet Take 1 tablet by mouth nightly. 90 tablet 0   ??? tamsulosin (FLOMAX) 0.4 MG capsule Take 1 capsule by mouth daily (Patient not taking: Reported on 11/07/2018) 30 capsule 11   ??? losartan (COZAAR) 50 MG tablet Take 50 mg by mouth daily     ??? tamsulosin (FLOMAX) 0.4  MG capsule Take 1 capsule by mouth daily (Patient not taking: Reported on 11/07/2018) 90 capsule 3   ??? loperamide (LOPERAMIDE A-D) 2 MG tablet Take 2 mg by mouth 4 times daily as needed for Diarrhea     ??? Melatonin 10 MG CAPS Take by mouth     ??? Simethicone 125 MG CAPS Take by mouth     ??? Calcium Carbonate-Vitamin D (CALCIUM-VITAMIN D) 500-200 MG-UNIT per tablet Take 1 tablet by mouth 2 times daily (with meals)     ??? Glucosamine-Chondroitin (GLUCOSAMINE CHONDR COMPLEX PO) Take by mouth daily       Current Facility-Administered Medications   Medication Dose Route Frequency Provider Last Rate Last Dose   ??? testosterone cypionate (DEPOTESTOTERONE CYPIONATE) injection 200 mg  200 mg Intramuscular Once Sanjuana Kava, MD            No Known Allergies    Subjective:     Review of Systems   Constitutional: Negative for activity change, appetite change, chills, fatigue and fever.   HENT: Positive for dental problem and ear pain (left). Negative for congestion, ear discharge, hearing loss, postnasal drip, sinus pressure, sneezing and sore throat.    Eyes: Negative for visual  disturbance.   Respiratory: Negative for cough and shortness of breath.    Cardiovascular: Negative for chest pain and palpitations.   Gastrointestinal: Negative for abdominal pain and diarrhea.   Skin: Negative for rash.   Neurological: Negative for headaches.       Objective:      Physical Exam  Vitals signs and nursing note reviewed.   Constitutional:       Appearance: He is well-developed.   HENT:      Right Ear: Tympanic membrane, ear canal and external ear normal.      Left Ear: Tympanic membrane, ear canal and external ear normal.      Nose: Mucosal edema present.      Right Sinus: No maxillary sinus tenderness or frontal sinus tenderness.      Left Sinus: No maxillary sinus tenderness or frontal sinus tenderness.      Mouth/Throat:      Mouth: Mucous membranes are moist.      Dentition: Dental tenderness, gingival swelling and dental caries present.      Pharynx: No posterior oropharyngeal erythema.     Eyes:      Conjunctiva/sclera: Conjunctivae normal.   Neck:      Musculoskeletal: Normal range of motion and neck supple.   Cardiovascular:      Rate and Rhythm: Normal rate and regular rhythm.      Heart sounds: Normal heart sounds. No murmur.   Pulmonary:      Effort: Pulmonary effort is normal. No respiratory distress.      Breath sounds: Normal breath sounds. No wheezing.   Abdominal:      General: Bowel sounds are normal.      Palpations: Abdomen is soft.      Tenderness: There is no abdominal tenderness. There is no guarding.   Lymphadenopathy:      Cervical: No cervical adenopathy.       BP 138/76    Pulse 66    Temp 98 ??F (36.7 ??C) (Tympanic)    Resp 18    Ht 6' (1.829 m)    Wt 196 lb 6.4 oz (89.1 kg)    SpO2 97%    BMI 26.64 kg/m??     Assessment:       Diagnosis  Orders   1. Dental infection  HYDROcodone-acetaminophen ([place]) 5-325 MG per tablet             Plan:        Dental infection: new; I will treat with augmentin and I recommended that he follow up with his dentist next week. He was given a few  norco for pain.    Return if symptoms worsen or fail to improve.      Orders Placed This Encounter   Medications   ??? amoxicillin-clavulanate (AUGMENTIN) 875-125 MG per tablet     Sig: Take 1 tablet by mouth 2 times daily for 10 days     Dispense:  20 tablet     Refill:  0   ??? HYDROcodone-acetaminophen (NORCO) 5-325 MG per tablet     Sig: Take 1 tablet by mouth every 6 hours as needed for Pain for up to 3 days. Intended supply: 3 days. Take lowest dose possible to manage pain     Dispense:  12 tablet     Refill:  0     Reduce doses taken as pain becomes manageable       Patientgiven educational materials - see patient instructions.  Discussed use, benefit,and side effects of prescribed medications.  All patient questions answered. Ptvoiced understanding. Reviewed health maintenance.  Instructed to continue currentmedications, diet and exercise.  Patient agreed with treatment plan. Follow up asdirected.     Electronically signed by Nell Range, MD on 11/07/2018 at 3:39 PM

## 2019-03-26 ENCOUNTER — Ambulatory Visit
Admit: 2019-03-26 | Discharge: 2019-03-26 | Payer: MEDICARE | Attending: Physician Assistant | Primary: Internal Medicine

## 2019-03-26 ENCOUNTER — Inpatient Hospital Stay: Payer: MEDICARE | Primary: Internal Medicine

## 2019-03-26 DIAGNOSIS — R059 Cough, unspecified: Secondary | ICD-10-CM

## 2019-03-26 DIAGNOSIS — Z1159 Encounter for screening for other viral diseases: Secondary | ICD-10-CM

## 2019-03-26 NOTE — Progress Notes (Signed)
Subjective:      Patient ID: Jay Lewis is a 73 y.o. male.    URI    This is a new problem. The current episode started 1 to 4 weeks ago (x 3 weeks). The problem has been waxing and waning. Associated symptoms include congestion, coughing (semi-productive), diarrhea and nausea. Pertinent negatives include no chest pain or headaches.       Review of Systems   Constitutional: Positive for fatigue and unexpected weight change (weight loss 20#). Negative for fever (unmeasured).   HENT: Positive for congestion.         Loss of taste   Respiratory: Positive for cough (semi-productive) and shortness of breath (exertional).    Cardiovascular: Negative for chest pain.   Gastrointestinal: Positive for diarrhea and nausea.   Musculoskeletal: Positive for myalgias.   Neurological: Negative for headaches.       Objective:   Physical Exam  HENT:      Head: Normocephalic.      Right Ear: Tympanic membrane normal.      Left Ear: Tympanic membrane normal.      Mouth/Throat:      Mouth: Mucous membranes are moist.   Neck:      Musculoskeletal: Neck supple.   Cardiovascular:      Rate and Rhythm: Normal rate and regular rhythm.   Pulmonary:      Effort: Pulmonary effort is normal.   Neurological:      General: No focal deficit present.      Mental Status: He is alert and oriented to person, place, and time.   Psychiatric:         Mood and Affect: Mood normal.         Assessment:      1. Cough    2. Encounter for laboratory testing for COVID-19 virus    3. Loss of taste          Plan:      COVID testing obtained.  Discussed more extensive evaluation with laboratory studies.  Patient is scheduled with PCP next week and wishes to hold on additional testing until that visit.  Symptomatic care.  Patient advised to self-quarantine until test results.  Follow-up with PCP.        Fredirick Maudlin, PA

## 2019-03-27 NOTE — Care Coordination-Inpatient (Signed)
Yellow alert noted in [facility] remote symptom monitoring program. Messaged patient to notify Pryor Curia if symptoms have worsened since yesterday.      Truett Mainland, RN, 11:43 AM  Just wanted to let you know that The Get Well Team at [facility] has received your message. Thanks for letting us know. Please let us know if any of your symptoms are worse than yesterday, or if you have any questions or concerns and would like to speak with a nurse. We are available 8-4 M-F, and 9-1 S/S. My number is [phone]

## 2019-03-27 NOTE — Care Coordination-Inpatient (Signed)
Yellow alert noted in [facility] remote symptom monitoring program. Messaged patient to notify Pryor Curia if symptoms have worsened since yesterday.        Truett Mainland, RN, 11:43 AM  Just wanted to let you know that The Get Well Team at [facility] has received your message. Thanks for letting us know. Please let us know if any of your symptoms are worse than yesterday, or if you have any questions or concerns and would like to speak with a nurse. We are available 8-4 M-F, and 9-1 S/S. My number is [phone]

## 2019-04-01 LAB — COVID-19 AMBULATORY: SARS-CoV-2, NAA: NOT DETECTED

## 2019-06-28 ENCOUNTER — Inpatient Hospital Stay: Admit: 2019-06-28 | Payer: MEDICARE | Primary: Internal Medicine

## 2019-06-28 ENCOUNTER — Ambulatory Visit: Admit: 2019-06-28 | Discharge: 2019-06-28 | Payer: MEDICARE | Attending: Urology | Primary: Internal Medicine

## 2019-06-28 DIAGNOSIS — R972 Elevated prostate specific antigen [PSA]: Secondary | ICD-10-CM

## 2019-06-28 LAB — PSA, DIAGNOSTIC: PSA: 3.79 ug/L (ref <4.1)

## 2019-06-28 NOTE — Progress Notes (Signed)
[name], MD        [place]  [place] [id number]  Dept: [phone]  Dept Fax: [phone]  Loc: [phone]      [facility] Urology Office Note - follow up Patient    Patient:  Jay Lewis  Date of Birth: 1946-06-08  Date: 06/28/2019    The patient is a 73 y.o. male who presents today for evaluation of the following problems:  BPH, PSA check  Chief Complaint   Patient presents with   ??? Benign Prostatic Hypertrophy     1 year f/u    referred/consultation requested by [name], MD.    HISTORY OF PRESENT ILLNESS:     BPH  Onset was   Years ago  Overall, the problem(s) are  stable  Severity is described as mild.   Associated Symptoms: No dysuria, no gross hematuria.  Current Pain Severity: 0     Happy with urinary stream and voiding. Stopped talking flomax    Secondary Diagnosis:    Elevated PSA  Onset was   Years ago  Overall, the problem(s) are unchanged.  Severity is described as mild.   Associated Symptoms: No dysuria, no gross hematuria.  Current Pain Severity: 0     Last PSA check one year ago. Had been rising. Will need check today.      ED  Onset was   Years ago  Overall, the problem(s) are stable.  Severity is described as mild.   Associated Symptoms: No dysuria, no gross hematuria.  Current Pain Severity: 0     Not interested in medication.  "infrequent". Maintaining erection is an issue. Not bothered at this time.      Summary of Previous Records:  Pt with hx of melanoma. Getting skin treatments periodically  Hx of alcoholism. Recently started AA.      Requested/reviewed records from [name], MD office and/or outside physician/EMR    (Patient's old records have been requested, reviewed and pertinent findings summarized in today's note.)    Procedures Today: N/A      Last several PSA's:  Lab Results   Component Value Date    PSA 3.72 06/22/2018    PSA 3.88 06/06/2017    PSA 3.19 01/13/2015       Last total  testosterone:  Lab Results   Component Value Date    TESTOSTERONE 489 11/22/2011       Urinalysis today:  No results found for this visit on 06/28/19.    Last BUN and creatinine:  Lab Results   Component Value Date    BUN 18 12/17/2012     Lab Results   Component Value Date    CREATININE 1.05 12/17/2012       Additional Lab/Culture results: none    Imaging Reviewed during this Office Visit:   Kirsty Monjaraz ALI [name], MD independently reviewed the images and verified the radiology reports from:    No results found.    PAST MEDICAL, FAMILY AND SOCIAL HISTORY:  Past Medical History:   Diagnosis Date   ??? Actinic keratosis    ??? Anemia, mild    ??? Anxiety    ??? Chondromalacia    ??? Collagenous colitis    ??? Depression    ??? Diverticulosis    ??? Elevated blood pressure    ??? GERD (gastroesophageal reflux disease)    ??? Head trauma    ??? Hyperlipidemia    ???  Insomnia    ??? Osteoarthritis    ??? Right hip pain    ??? Skin cancer    ??? Tachycardia     history of   ??? Tremor     with right-sided weakness     Past Surgical History:   Procedure Laterality Date   ??? COLONOSCOPY  05/24/2011    diverticulosis/hemorrhoid   ??? COLONOSCOPY  2016    Colitis   ??? FINGER SURGERY      thumb   ??? PROSTATE SURGERY  12/17/2011    biopsy     Family History   Problem Relation Age of Onset   ??? Stroke Father    ??? Heart Disease Father      Outpatient Medications Marked as Taking for the 06/28/19 encounter (Office Visit) with Kennith Maes, MD   Medication Sig Dispense Refill   ??? losartan (COZAAR) 50 MG tablet Take 50 mg by mouth daily     ??? acetaminophen (TYLENOL) 500 MG tablet Take 500 mg by mouth every 6 hours as needed for Pain     ??? Glucosamine-Chondroitin (GLUCOSAMINE CHONDR COMPLEX PO) Take by mouth daily     ??? zolpidem (AMBIEN) 10 MG tablet Take 5 mg by mouth nightly as needed     ??? amitriptyline (ELAVIL) 50 MG tablet Take 1 tablet by mouth nightly. 90 tablet 0       Patient has no known allergies.  Social History     Tobacco Use   Smoking Status Former Smoker   ???  Packs/day: 0.50   ??? Years: 15.00   ??? Pack years: 7.50   ??? Start date: 09/16/1965   ??? Last attempt to quit: 06/23/1980   ??? Years since quitting: 39.0   Smokeless Tobacco Never Used      (If patient a smoker, smoking cessation counseling offered)   Social History     Substance and Sexual Activity   Alcohol Use Not Currently   ??? Frequency: 4 or more times a week   ??? Binge frequency: Daily or almost daily    Comment: Last drink Sept 29 2020- pt in AA       REVIEW OF SYSTEMS:  Constitutional: negative  Eyes: negative  Respiratory: negative  Cardiovascular: negative  Gastrointestinal: negative  Genitourinary: see HPI  Musculoskeletal: negative  Skin: negative   Neurological: negative  Hematological/Lymphatic: negative  Psychological: negative      Physical Exam:    This a 73 y.o. male  Vitals:    06/28/19 0812   BP: 134/76   Pulse: 76   Resp: 12   SpO2: 95%     Body mass index is 25.36 kg/m??.  Constitutional: Patient in no acute distress;         Assessment and Plan        1. Benign localized prostatic hyperplasia with lower urinary tract symptoms (LUTS)    2. Erectile dysfunction, unspecified erectile dysfunction type    3. Rising PSA level               Plan:       Rising PSA- needs PSA today and in one year.   Discontinue flomax  Is no longer interested in sildenafil for ED  Follow up in one year with PSA prior        Prescriptions Ordered:  No orders of the defined types were placed in this encounter.     Orders Placed:  Orders Placed This Encounter   Procedures   ???  PSA, Diagnostic     Standing Status:   Future     Standing Expiration Date:   06/27/2021   ??? PSA, Diagnostic     Standing Status:   Future     Standing Expiration Date:   06/27/2020            [name], MD

## 2019-12-24 ENCOUNTER — Ambulatory Visit: Admit: 2019-12-24 | Discharge: 2019-12-24 | Payer: MEDICARE | Attending: Family | Primary: Internal Medicine

## 2019-12-24 DIAGNOSIS — M1712 Unilateral primary osteoarthritis, left knee: Secondary | ICD-10-CM

## 2019-12-24 MED ORDER — HYDROCODONE-ACETAMINOPHEN 5-325 MG PO TABS
5-325 MG | ORAL_TABLET | Freq: Four times a day (QID) | ORAL | 0 refills | Status: AC | PRN
Start: 2019-12-24 — End: 2019-12-29

## 2019-12-24 NOTE — Progress Notes (Signed)
[place]  [address] [place]  [phone]      HPI:     Pt saw pain management but is no longer seeing them. His PCP told him that the surgeon had to manage his pain. The surgeon told him to go the ER for pain management.     Knee Pain   The incident occurred more than 1 week ago. The incident occurred at home. There was no injury mechanism. The pain is present in the left knee. The quality of the pain is described as aching. The pain is at a severity of 9/10. The pain is severe. The pain has been fluctuating since onset. Pertinent negatives include no inability to bear weight, numbness or tingling. He reports no foreign bodies present. The symptoms are aggravated by movement, weight bearing and palpation.       Current Outpatient Medications   Medication Sig Dispense Refill   ??? oxaprozin (DAYPRO) 600 MG tablet TAKE 1 TABLET BY MOUTH TWICE DAILY     ??? losartan (COZAAR) 50 MG tablet Take 50 mg by mouth daily     ??? bismuth subsalicylate (PEPTO BISMOL) 262 MG chewable tablet Take 524 mg by mouth 4 times daily (before meals and nightly)     ??? acetaminophen (TYLENOL) 500 MG tablet Take 500 mg by mouth every 6 hours as needed for Pain     ??? Melatonin 10 MG CAPS Take by mouth     ??? Calcium Carbonate-Vitamin D (CALCIUM-VITAMIN D) 500-200 MG-UNIT per tablet Take 1 tablet by mouth 2 times daily (with meals)     ??? sildenafil (VIAGRA) 100 MG tablet Take 1 tablet by mouth as needed for Erectile Dysfunction 12 tablet 3   ??? Glucosamine-Chondroitin (GLUCOSAMINE CHONDR COMPLEX PO) Take by mouth daily     ??? zolpidem (AMBIEN) 10 MG tablet Take 5 mg by mouth nightly as needed     ??? amitriptyline (ELAVIL) 50 MG tablet Take 1 tablet by mouth nightly. 90 tablet 0   ??? acetaminophen (TYLENOL 8 HOUR) 650 MG extended release tablet Tylenol Arthritis Pain 650 mg tablet,extended release   Take 2 tablets every 8 hours by oral route as needed.     ??? tamsulosin (FLOMAX) 0.4 MG capsule Take 1  capsule by mouth daily (Patient not taking: Reported on 11/07/2018) 30 capsule 11   ??? loperamide (LOPERAMIDE A-D) 2 MG tablet Take 2 mg by mouth 4 times daily as needed for Diarrhea     ??? Simethicone 125 MG CAPS Take by mouth       Current Facility-Administered Medications   Medication Dose Route Frequency Provider Last Rate Last Admin   ??? testosterone cypionate (DEPOTESTOTERONE CYPIONATE) injection 200 mg  200 mg Intramuscular Once Sanjuana Kava, MD         No Known Allergies    All patients pastmedical, surgical, social and family history has been reviewed.    Subjective:      Review of Systems   Constitutional: Positive for activity change. Negative for appetite change, fatigue and fever.   Respiratory: Negative for cough, shortness of breath and wheezing.    Cardiovascular: Negative for chest pain and palpitations.   Musculoskeletal:        Left knee pain    Neurological: Negative for dizziness, tingling, numbness and headaches.         Objective:      Physical Exam  Vitals signs and nursing note reviewed.  Constitutional:       Appearance: Normal appearance.   HENT:      Head: Normocephalic and atraumatic.   Cardiovascular:      Rate and Rhythm: Normal rate and regular rhythm.      Heart sounds: Normal heart sounds.   Pulmonary:      Effort: Pulmonary effort is normal.      Breath sounds: Normal breath sounds.   Musculoskeletal:      Left knee: He exhibits decreased range of motion and swelling (mild). Tenderness found. Medial joint line and lateral joint line tenderness noted.   Skin:     Capillary Refill: Capillary refill takes less than 2 seconds.   Neurological:      General: No focal deficit present.      Mental Status: He is alert and oriented to person, place, and time.           Assessment:       Diagnosis Orders   1. Primary osteoarthritis of left knee     2. Chronic pain of left knee         Plan:      Reviewed xray that patient had done at [facility]  Will give norco as needed to use for severe pain    Continue ice as needed.   Return if symptoms do not improve or worsen   Return PRN  No follow-ups on file.  No orders of the defined types were placed in this encounter.    No orders of the defined types were placed in this encounter.    Controlled Substance Monitoring:    Acute and Chronic Pain Monitoring:   RX Monitoring 12/24/2019   Attestation -   Periodic Controlled Substance Monitoring No signs of potential drug abuse or diversion identified.;Obtaining appropriate analgesic effect of treatment.         Patient given educational materials - see patient instructions. All patient questionsanswered.  Pt voiced understanding. Reviewed health maintenance.     Electronically signed by Sumner Boast, CNP on 12/24/2019 at 5:09 PM

## 2020-07-03 ENCOUNTER — Ambulatory Visit: Admit: 2020-07-03 | Discharge: 2020-07-03 | Payer: MEDICARE | Attending: Urology | Primary: Internal Medicine

## 2020-07-03 ENCOUNTER — Inpatient Hospital Stay: Admit: 2020-07-03 | Payer: MEDICARE | Primary: Internal Medicine

## 2020-07-03 DIAGNOSIS — N401 Enlarged prostate with lower urinary tract symptoms: Secondary | ICD-10-CM

## 2020-07-03 LAB — PSA, DIAGNOSTIC: PSA: 5.69 ug/L — ABNORMAL HIGH (ref <4.1)

## 2020-07-03 NOTE — Progress Notes (Signed)
[name], MD        [facility]  [place] [address]  Dept: [phone]  Dept Fax: [phone]  Loc: [phone]      [facility] Urology Office Note - follow up Patient    Patient:  Jay Lewis  Date of Birth: February 19, 1946  Date: 07/03/2020    The patient is a 74 y.o. male who presents today for evaluation of the following problems:  BPH, PSA check  Chief Complaint   Patient presents with   ??? Benign Prostatic Hypertrophy     1 year f/u    referred/consultation requested by Jay Smack, MD.    HISTORY OF PRESENT ILLNESS:     BPH  Happy with urinary stream and voiding. Stopped talking flomax  Recent knee surgery- was catheterized postop but now voiding well.       Elevated PSA  Last PSA check one year ago. Had been rising. Will need check today.      ED  Not interested in medication.  "infrequent". Maintaining erection is an issue. Not bothered at this time.      Summary of Previous Records:  Pt with hx of melanoma. Getting skin treatments periodically  Hx of alcoholism. Recently started AA.      Requested/reviewed records from Jay Smack, MD office and/or outside physician/EMR    (Patient's old records have been requested, reviewed and pertinent findings summarized in today's note.)    Procedures Today: N/A      Last several PSA's:  Lab Results   Component Value Date    PSA 3.79 06/28/2019    PSA 3.72 06/22/2018    PSA 3.88 06/06/2017       Last total testosterone:  Lab Results   Component Value Date    TESTOSTERONE 489 11/22/2011       Urinalysis today:  No results found for this visit on 07/03/20.    Last BUN and creatinine:  Lab Results   Component Value Date    BUN 18 12/17/2012     Lab Results   Component Value Date    CREATININE 1.05 12/17/2012       Additional Lab/Culture results: none    Imaging Reviewed during this Office Visit:   [name] ALI [name], MD independently reviewed the images and verified the radiology reports from:    No  results found.    PAST MEDICAL, FAMILY AND SOCIAL HISTORY:  Past Medical History:   Diagnosis Date   ??? Actinic keratosis    ??? Anemia, mild    ??? Anxiety    ??? Chondromalacia    ??? Collagenous colitis    ??? Depression    ??? Diverticulosis    ??? Elevated blood pressure    ??? GERD (gastroesophageal reflux disease)    ??? Head trauma    ??? Hyperlipidemia    ??? Insomnia    ??? Osteoarthritis    ??? Right hip pain    ??? Skin cancer    ??? Tachycardia     history of   ??? Tremor     with right-sided weakness     Past Surgical History:   Procedure Laterality Date   ??? COLONOSCOPY  05/24/2011    diverticulosis/hemorrhoid   ??? COLONOSCOPY  2016    Colitis   ??? FINGER SURGERY      thumb   ??? PROSTATE SURGERY  12/17/2011    biopsy  Family History   Problem Relation Age of Onset   ??? Stroke Father    ??? Heart Disease Father      Outpatient Medications Marked as Taking for the 07/03/20 encounter (Office Visit) with Kennith Maes, MD   Medication Sig Dispense Refill   ??? acetaminophen (TYLENOL 8 HOUR) 650 MG extended release tablet Tylenol Arthritis Pain 650 mg tablet,extended release   Take 2 tablets every 8 hours by oral route as needed.     ??? losartan (COZAAR) 50 MG tablet Take 50 mg by mouth daily     ??? loperamide (LOPERAMIDE A-D) 2 MG tablet Take 2 mg by mouth 4 times daily as needed for Diarrhea     ??? bismuth subsalicylate (PEPTO BISMOL) 262 MG chewable tablet Take 524 mg by mouth 4 times daily (before meals and nightly)     ??? acetaminophen (TYLENOL) 500 MG tablet Take 500 mg by mouth every 6 hours as needed for Pain     ??? Simethicone 125 MG CAPS Take by mouth     ??? Calcium Carbonate-Vitamin D (CALCIUM-VITAMIN D) 500-200 MG-UNIT per tablet Take 1 tablet by mouth 2 times daily (with meals)     ??? sildenafil (VIAGRA) 100 MG tablet Take 1 tablet by mouth as needed for Erectile Dysfunction 12 tablet 3   ??? Glucosamine-Chondroitin (GLUCOSAMINE CHONDR COMPLEX PO) Take by mouth daily     ??? zolpidem (AMBIEN) 10 MG tablet Take 5 mg by mouth nightly as needed      ??? amitriptyline (ELAVIL) 50 MG tablet Take 1 tablet by mouth nightly. 90 tablet 0       Patient has no known allergies.  Social History     Tobacco Use   Smoking Status Former Smoker   ??? Packs/day: 0.50   ??? Years: 15.00   ??? Pack years: 7.50   ??? Start date: 09/16/1965   ??? Quit date: 06/23/1980   ??? Years since quitting: 40.0   Smokeless Tobacco Never Used      (If patient a smoker, smoking cessation counseling offered)   Social History     Substance and Sexual Activity   Alcohol Use Yes    Comment: Last drink Sept 29 2020- pt in AA       REVIEW OF SYSTEMS:  Constitutional: negative  Eyes: negative  Respiratory: negative  Cardiovascular: negative  Gastrointestinal: negative  Genitourinary: see HPI  Musculoskeletal: negative  Skin: negative   Neurological: negative  Hematological/Lymphatic: negative  Psychological: negative      Physical Exam:    This a 74 y.o. male  Vitals:    07/03/20 0843   BP: 134/86   Pulse: 60   Resp: 16   SpO2: 98%     Body mass index is 24.01 kg/m??.  Constitutional: Patient in no acute distress;         Assessment and Plan        1. Benign localized prostatic hyperplasia with lower urinary tract symptoms (LUTS)    2. Erectile dysfunction, unspecified erectile dysfunction type    3. Rising PSA level               Plan:       Rising PSA- needs PSA today and in one year.   BPH- no flomax. Recent knee surgery- was catheterized postop but now voiding well.   ED- not interested in sildenafil for ED        Follow up in one year with PSA prior  Prescriptions Ordered:  No orders of the defined types were placed in this encounter.     Orders Placed:  No orders of the defined types were placed in this encounter.           Ezme Duch ALI [name], MD

## 2020-07-05 NOTE — Telephone Encounter (Signed)
Patient saw Dr [name] this past Monday 07/03/2020 for his yearly check.  He did not get his PSA until after the appointment.  Patient called stating he has seen the results and now wants to speak with Dr [name] about those results.  Please call him back [phone] if he needs another appointment or what Dr Laurelyn Sickle recommendations are,.

## 2020-07-05 NOTE — Telephone Encounter (Signed)
Spoke with patient. Set up virtual visit for next Monday to discuss elevated PSA with Dr. [name].

## 2020-07-10 ENCOUNTER — Telehealth: Admit: 2020-07-10 | Discharge: 2020-07-10 | Payer: MEDICARE | Attending: Urology | Primary: Internal Medicine

## 2020-07-10 DIAGNOSIS — R972 Elevated prostate specific antigen [PSA]: Secondary | ICD-10-CM

## 2020-07-10 NOTE — Progress Notes (Signed)
Jay Lewis is a 74 y.o. male evaluated via telephone on 07/10/2020.      Consent:  He and/or health care decision maker is aware that that he may receive a bill for this telephone service, depending on his insurance coverage, and has provided verbal consent to proceed: Yes      Documentation:  I communicated with the patient and/or health care decision maker about elevated psa.   Details of this discussion including any medical advice provided:     Here with psa check    Lab Results   Component Value Date    PSA 5.69 (H) 07/03/2020    PSA 3.79 06/28/2019    PSA 3.72 06/22/2018         PLAN:  Discussed options  Will recheck psa in six weeks        I affirm this is a Patient Initiated Episode with a Patient who has not had a related appointment within my department in the past 7 days or scheduled within the next 24 hours.    Patient identification was verified at the start of the visit: Yes    Total Time: minutes: 5-10 minutes    The visit was conducted pursuant to the emergency declaration under the [place] waiver authority and the [employer] Act.  Patient identification was verified, and a caregiver was present when appropriate. The patient was located in a state where the provider was credentialed to provide care.    Note: not billable if this call serves to triage the patient into an appointment for the relevant concern      Cornel Werber ALI [name], MD

## 2020-08-21 ENCOUNTER — Ambulatory Visit: Payer: MEDICARE | Attending: Urology | Primary: Internal Medicine

## 2020-08-21 NOTE — Telephone Encounter (Signed)
Patient no showed appointment with Dr. Barrett Shell today at 10:00 am. Writer left message on patient's voicemail with clinic phone number to reschedule. No show letter mailed.

## 2020-08-29 ENCOUNTER — Ambulatory Visit: Admit: 2020-08-29 | Discharge: 2020-08-29 | Payer: MEDICARE | Attending: Podiatrist | Primary: Internal Medicine

## 2020-08-29 DIAGNOSIS — L84 Corns and callosities: Secondary | ICD-10-CM

## 2020-08-29 NOTE — Progress Notes (Signed)
Subjective:  Jay Lewis is a 74 y.o. male who presents to the office today complaining of a painful callous on the Right foot .  Symptoms began 3 month(s) ago.  Patient relates pain is Present.  Pain is rated 3 out of 10 and is described as intermittent.  Treatments prior to today's visit include: none.  Patient did have the knee procedure done well back in the left side not sure the cause issues in the right foot.  Currently denies F/C/N/V.     No Known Allergies    Past Medical History:   Diagnosis Date   ??? Actinic keratosis    ??? Anemia, mild    ??? Anxiety    ??? Chondromalacia    ??? Collagenous colitis    ??? Depression    ??? Diverticulosis    ??? Elevated blood pressure    ??? GERD (gastroesophageal reflux disease)    ??? Head trauma    ??? Hyperlipidemia    ??? Insomnia    ??? Osteoarthritis    ??? Right hip pain    ??? Skin cancer    ??? Tachycardia     history of   ??? Tremor     with right-sided weakness       Prior to Admission medications    Medication Sig Start Date End Date Taking? Authorizing Provider   oxaprozin (DAYPRO) 600 MG tablet TAKE 1 TABLET BY MOUTH TWICE DAILY 12/14/19  Yes Historical Provider, MD   acetaminophen (TYLENOL 8 HOUR) 650 MG extended release tablet Tylenol Arthritis Pain 650 mg tablet,extended release   Take 2 tablets every 8 hours by oral route as needed.   Yes Historical Provider, MD   tamsulosin (FLOMAX) 0.4 MG capsule Take 1 capsule by mouth daily 04/07/17  Yes Kennith Maes, MD   losartan (COZAAR) 50 MG tablet Take 50 mg by mouth daily   Yes Historical Provider, MD   loperamide (LOPERAMIDE A-D) 2 MG tablet Take 2 mg by mouth 4 times daily as needed for Diarrhea   Yes Historical Provider, MD   bismuth subsalicylate (PEPTO BISMOL) 262 MG chewable tablet Take 524 mg by mouth 4 times daily (before meals and nightly)   Yes Historical Provider, MD   acetaminophen (TYLENOL) 500 MG tablet Take 500 mg by mouth every 6 hours as needed for Pain   Yes Historical Provider, MD   Melatonin 10 MG CAPS Take by mouth     Yes Historical Provider, MD   Simethicone 125 MG CAPS Take by mouth   Yes Historical Provider, MD   Calcium Carbonate-Vitamin D (CALCIUM-VITAMIN D) 500-200 MG-UNIT per tablet Take 1 tablet by mouth 2 times daily (with meals)   Yes Historical Provider, MD   sildenafil (VIAGRA) 100 MG tablet Take 1 tablet by mouth as needed for Erectile Dysfunction 10/18/15  Yes Sanjuana Kava, MD   Glucosamine-Chondroitin (GLUCOSAMINE CHONDR COMPLEX PO) Take by mouth daily   Yes Historical Provider, MD   zolpidem (AMBIEN) 10 MG tablet Take 5 mg by mouth nightly as needed 12/19/14  Yes Historical Provider, MD   amitriptyline (ELAVIL) 50 MG tablet Take 1 tablet by mouth nightly. 11/23/13  Yes Anne Hahn, MD       Past Surgical History:   Procedure Laterality Date   ??? COLONOSCOPY  05/24/2011    diverticulosis/hemorrhoid   ??? COLONOSCOPY  2016    Colitis   ??? FINGER SURGERY      thumb   ??? KNEE SURGERY Left 01/2020   ???  PROSTATE SURGERY  12/17/2011    biopsy       Family History   Problem Relation Age of Onset   ??? Stroke Father    ??? Heart Disease Father        Social History     Tobacco Use   ??? Smoking status: Former Smoker     Packs/day: 0.50     Years: 15.00     Pack years: 7.50     Start date: 09/16/1965     Quit date: 06/23/1980     Years since quitting: 40.2   ??? Smokeless tobacco: Never Used   Substance Use Topics   ??? Alcohol use: Yes     Comment: Last drink Sept 29 2020- pt in AA       ROS: All 14 ROS systems reviewed and pertinent positives noted above, all others negative.     Lower Extremity Physical Examination:     Vitals:   Vitals:    08/29/20 1519   BP: 126/68   Pulse: 72   Resp: 20     General: AAO x 3 in NAD.     Vascular: DP and PT pulses palpable 2/4, bilateral.  CFT <3 seconds, bilateral.  Hair growth present to the level of the digits, bilateral.  Edema absent, Bilateral.  Varicosities absent, bilateral. Erythema absent, bilateral. Distal Rubor absent bilateral.  Temperature within normal limits bilateral.  Hyperpigmentation absent bilateral. Atrophic skin no.    Neurological: Sensation intact to light touch to level of digits, bilateral.  Protective sensation intact 10/10 sites via 5.07/10g Semmes-Weinstein Monofilament, bilateral.  negative Tinel's, bilateral.  negative Valleix sign, bilateral.  Vibratory intact bilateral.  Reflexes Decreased bilateral.  Paresthesias negative.  Dysthesias negative.  Sharp/dull intact bilateral.    Musculoskeletal: Muscle strength 5/5, bilateral.  Pain present upon palpation of the callous lesion Right. within normal limits medial longitudinal arch, Bilateral.  Ankle ROM within normal limits,Bilateral.  1st MPJ ROM within normal limits, Bilateral.  Dorsally contracted digits absent digits none, Bilateral. Other foot deformities mild foot varus R.    Integument: Warm, dry, supple, bilateral.  Hyperkeratotic tissue is present.  Central core is present upon debridement.  Callous is located sub R 5th mwet head.  Open lesion absent, Bilateral.  Interdigital maceration absent to web spaces , bilateral.  Nails dystrophic.  Fissures absent, Bilateral.      Asessment: Patient is a 74 y.o. male with:    Diagnosis Orders   1. Corns and callosities     2. Foot pain, right         Plan: Patient examined and evaluated.  Current condition and treatment options discussed in detail.  Advised pt about offloading techniques.  Pt given option of prefabricated insoles with offloading pads.    Paring of 1 benign hyperkeratotic lesions took place with #10 blade or tissue nippers.  OTC treatments for a callous were discussed.  Patient is low level medical decision making overall.  Patient is acute uncomplicated condition.  No new testing.  Low risk morbidity current treatment course.  Contact office with any questions/problems/concerns.  RTC in 3week(s).

## 2020-10-13 ENCOUNTER — Inpatient Hospital Stay: Admit: 2020-10-13 | Payer: MEDICARE | Primary: Internal Medicine

## 2020-10-13 DIAGNOSIS — R972 Elevated prostate specific antigen [PSA]: Secondary | ICD-10-CM

## 2020-10-14 LAB — PSA, DIAGNOSTIC: PSA: 4.64 ug/L — ABNORMAL HIGH (ref <4.1)

## 2020-10-16 ENCOUNTER — Ambulatory Visit: Admit: 2020-10-16 | Discharge: 2020-10-16 | Payer: MEDICARE | Attending: Urology | Primary: Internal Medicine

## 2020-10-16 DIAGNOSIS — R972 Elevated prostate specific antigen [PSA]: Secondary | ICD-10-CM

## 2020-10-16 NOTE — Progress Notes (Signed)
Jay Brod, MD        [place]  [place] [address]  Dept: [phone]  Dept Fax: [phone]  Loc: [phone]      [facility] Office Note -    Patient:  Jay Lewis  Date of Birth: [birth year]  Date: 10/16/2020    The patient is a 75 y.o. male who presents today for evaluation of the following problems:  BPH, PSA check  Chief Complaint   Patient presents with   ??? Benign Prostatic Hypertrophy     6 wk f/u    referred/consultation requested by Jay Smack, MD.    HISTORY OF PRESENT ILLNESS:     BPH  Happy with urinary stream and voiding. Stopped talking flomax  Recent knee surgery- was catheterized postop but now voiding well.       Elevated PSA  Still elevated but dropped a bit      ED  Not interested in medication.  "infrequent". Maintaining erection is an issue. Not bothered at this time.      Summary of Previous Records:  Pt with hx of melanoma. Getting skin treatments periodically  Hx of alcoholism. Recently started AA.      Requested/reviewed records from Jay Smack, MD office and/or outside physician/EMR    (Patient's old records have been requested, reviewed and pertinent findings summarized in today's note.)    Procedures Today: N/A      Last several PSA's:  Lab Results   Component Value Date    PSA 4.64 (H) 10/13/2020    PSA 5.69 (H) 07/03/2020    PSA 3.79 06/28/2019       Last total testosterone:  Lab Results   Component Value Date    TESTOSTERONE 489 11/22/2011       Urinalysis today:  No results found for this visit on 10/16/20.    Last BUN and creatinine:  Lab Results   Component Value Date    BUN 18 12/17/2012     Lab Results   Component Value Date    CREATININE 1.05 12/17/2012       Additional Lab/Culture results: none    Imaging Reviewed during this Office Visit:   Jay Branden ALI [name], MD independently reviewed the images and verified the radiology reports from:    No results found.    PAST MEDICAL, FAMILY AND SOCIAL  HISTORY:  Past Medical History:   Diagnosis Date   ??? Actinic keratosis    ??? Anemia, mild    ??? Anxiety    ??? Chondromalacia    ??? Collagenous colitis    ??? Depression    ??? Diverticulosis    ??? Elevated blood pressure    ??? GERD (gastroesophageal reflux disease)    ??? Head trauma    ??? Hyperlipidemia    ??? Insomnia    ??? Osteoarthritis    ??? Right hip pain    ??? Skin cancer    ??? Tachycardia     history of   ??? Tremor     with right-sided weakness     Past Surgical History:   Procedure Laterality Date   ??? COLONOSCOPY  05/24/2011    diverticulosis/hemorrhoid   ??? COLONOSCOPY  2016    Colitis   ??? FINGER SURGERY      thumb   ??? KNEE SURGERY Left 01/2020   ??? PROSTATE SURGERY  12/17/2011    biopsy  Family History   Problem Relation Age of Onset   ??? Stroke Father    ??? Heart Disease Father      Outpatient Medications Marked as Taking for the 10/16/20 encounter (Office Visit) with Jay Maes, MD   Medication Sig Dispense Refill   ??? NAPROXEN PO Take by mouth     ??? acetaminophen (TYLENOL 8 HOUR) 650 MG extended release tablet Tylenol Arthritis Pain 650 mg tablet,extended release   Take 2 tablets every 8 hours by oral route as needed.     ??? losartan (COZAAR) 50 MG tablet Take 50 mg by mouth daily     ??? loperamide (LOPERAMIDE A-D) 2 MG tablet Take 2 mg by mouth 4 times daily as needed for Diarrhea     ??? bismuth subsalicylate (PEPTO BISMOL) 262 MG chewable tablet Take 524 mg by mouth 4 times daily (before meals and nightly)     ??? Melatonin 10 MG CAPS Take by mouth      ??? Calcium Carbonate-Vitamin D (CALCIUM-VITAMIN D) 500-200 MG-UNIT per tablet Take 1 tablet by mouth 2 times daily (with meals)     ??? sildenafil (VIAGRA) 100 MG tablet Take 1 tablet by mouth as needed for Erectile Dysfunction 12 tablet 3   ??? Glucosamine-Chondroitin (GLUCOSAMINE CHONDR COMPLEX PO) Take by mouth daily     ??? zolpidem (AMBIEN) 10 MG tablet Take 5 mg by mouth nightly as needed     ??? amitriptyline (ELAVIL) 50 MG tablet Take 1 tablet by mouth nightly. 90 tablet 0        Patient has no known allergies.  Social History     Tobacco Use   Smoking Status Former Smoker   ??? Packs/day: 0.50   ??? Years: 15.00   ??? Pack years: 7.50   ??? Start date: 09/16/1965   ??? Quit date: 06/23/1980   ??? Years since quitting: 40.3   Smokeless Tobacco Never Used      (If patient a smoker, smoking cessation counseling offered)   Social History     Substance and Sexual Activity   Alcohol Use Yes    Comment: Last drink Sept 29 2020- pt in AA       REVIEW OF SYSTEMS:  Constitutional: negative  Eyes: negative  Respiratory: negative  Cardiovascular: negative  Gastrointestinal: negative  Genitourinary: see HPI  Musculoskeletal: negative  Skin: negative   Neurological: negative  Hematological/Lymphatic: negative  Psychological: negative      Physical Exam:    This a 75 y.o. male  Vitals:    10/16/20 1002   BP: 132/76   Pulse: 57   Resp: 16   SpO2: 98%     Body mass index is 26.58 kg/m??.  Constitutional: Patient in no acute distress;         Assessment and Plan        1. Rising PSA level    2. Benign localized prostatic hyperplasia with lower urinary tract symptoms (LUTS)    3. Erectile dysfunction, unspecified erectile dysfunction type               Plan:       Rising PSA- still elevated. prostate MRI. If lesions present, will get biopsy.  BPH- stable. no flomax. Recent knee surgery (poss cause of PSA bump?)- was catheterized postop but now voiding well.   ED- not interested in sildenafil for ED        Follow up with prostate MRI results  Prescriptions Ordered:  No orders of the defined types were placed in this encounter.     Orders Placed:  No orders of the defined types were placed in this encounter.           Jay Duch ALI [name], MD

## 2020-10-16 NOTE — Addendum Note (Signed)
Addended by: Suella Grove D on: 10/16/2020 10:41 AM     Modules accepted: Orders

## 2020-10-16 NOTE — Progress Notes (Signed)
History of right knee implant:  Implants:   Implant Name Type Inv. Item Serial No. Manufacturer Lot No. LRB No. Used Action   CMNT BN BIO 40GM RPL [id number] - [id number] Cement CMNT BN BIO 40GM RPL [id number] [employer] [id number] Left 2 Implanted   CMPT PTLR 10MM 35MM ASYM - [id number] Orthopedic Implant CMPT PTLR 10MM 35MM ASYM [employer] [id number] Left 1 Implanted   FEMORAL PS COMP TK 6L - [id number] Orthopedic Implant FEMORAL PS COMP TK 6L [employer] [id number] Left 1 Implanted   BSPLT TIB 6 UNV KN TL STAB - [id number] Plate BSPLT TIB 6 UNV KN TL STAB [employer] [id number] Left 1 Implanted   INS TIB 6 TRTHLN PS X3 11MM - [id number] Orthopedic Implant INS TIB 6 TRTHLN PS X3 11MM [employer] [id number] Left 1 Implanted

## 2020-10-18 NOTE — Progress Notes (Signed)
Last seen by PCP  Dr. Corky Sing on 02/03/2020

## 2020-10-24 ENCOUNTER — Inpatient Hospital Stay: Admit: 2020-10-24 | Payer: MEDICARE | Primary: Internal Medicine

## 2020-10-24 DIAGNOSIS — R972 Elevated prostate specific antigen [PSA]: Secondary | ICD-10-CM

## 2020-10-24 LAB — CREATININE W/GFR POINT OF CARE
GFR Comment: >60 mL/min (ref >60)
GFR Non-African American: >60 mL/min (ref >60)
POC Creatinine: 1.1 mg/dL (ref 0.51–1.19)

## 2020-10-24 MED ORDER — SODIUM CHLORIDE FLUSH 0.9 % IV SOLN
0.9 % | INTRAVENOUS | Status: DC | PRN
Start: 2020-10-24 — End: 2020-10-27
  Administered 2020-10-24: 23:00:00 via INTRAVENOUS

## 2020-10-24 MED ORDER — GADOTERIDOL 279.3 MG/ML IV SOLN
279.3 MG/ML | Freq: Once | INTRAVENOUS | Status: AC | PRN
Start: 2020-10-24 — End: 2020-10-24
  Administered 2020-10-24: 23:00:00 via INTRAVENOUS

## 2020-11-13 ENCOUNTER — Ambulatory Visit: Admit: 2020-11-13 | Discharge: 2020-11-13 | Payer: MEDICARE | Attending: Urology | Primary: Internal Medicine

## 2020-11-13 DIAGNOSIS — R972 Elevated prostate specific antigen [PSA]: Secondary | ICD-10-CM

## 2020-11-13 NOTE — Progress Notes (Signed)
[name], MD        [place]  [place] [address]  Dept: [phone]  Dept Fax: [phone]  Loc: [phone]      [facility] Office Note -    Patient:  Jay Lewis  Date of Birth: [birth year]  Date: 11/13/2020    The patient is a 75 y.o. male who presents today for evaluation of the following problems:  BPH, PSA check  Chief Complaint   Patient presents with   ??? Elevated PSA     prostate mri results    referred/consultation requested by [name], MD.    HISTORY OF PRESENT ILLNESS:     BPH  Happy with urinary stream and voiding. Stopped talking flomax  Recent knee surgery- was catheterized postop but now voiding well.       Elevated PSA  Still elevated but dropped a bit  Here with PIRADS -3 transition zone lesions.      ED   "infrequent". Maintaining erection is an issue. Sildenafil was offered in the past      Summary of Previous Records:  Pt with hx of melanoma. Getting skin treatments periodically  Hx of alcoholism. Recently started AA.      Requested/reviewed records from [name], MD office and/or outside physician/EMR    (Patient's old records have been requested, reviewed and pertinent findings summarized in today's note.)    Procedures Today: N/A      Last several PSA's:  Lab Results   Component Value Date    PSA 4.64 (H) 10/13/2020    PSA 5.69 (H) 07/03/2020    PSA 3.79 06/28/2019       Last total testosterone:  Lab Results   Component Value Date    TESTOSTERONE 489 11/22/2011       Urinalysis today:  No results found for this visit on 11/13/20.    Last BUN and creatinine:  Lab Results   Component Value Date    BUN 18 12/17/2012     Lab Results   Component Value Date    CREATININE 1.10 10/24/2020       Additional Lab/Culture results: none    Imaging Reviewed during this Office Visit:   Mattson Dayal ALI [name], MD independently reviewed the images and verified the radiology reports from:    1. No findings suspicious for an  immediate or high-grade neoplasia. ??However   there are at least two discrete PI-RADS 3 lesions noted within the base   involving the transitional zones, which are otherwise considered equivocal.   There is also a 4 mm peripheral zone PI-RADS 2 lesion within the left   prostatic apex which does demonstrate washout kinetics and remains   indeterminate.   2. Mild sequela of prostatitis.   3. Colonic diverticulosis.         PAST MEDICAL, FAMILY AND SOCIAL HISTORY:  Past Medical History:   Diagnosis Date   ??? Actinic keratosis    ??? Anemia, mild    ??? Anxiety    ??? Chondromalacia    ??? Collagenous colitis    ??? Depression    ??? Diverticulosis    ??? Elevated blood pressure    ??? GERD (gastroesophageal reflux disease)    ??? Head trauma    ??? Hyperlipidemia    ??? Insomnia    ??? Osteoarthritis    ??? Right hip pain    ??? Skin cancer    ???  Tachycardia     history of   ??? Tremor     with right-sided weakness     Past Surgical History:   Procedure Laterality Date   ??? COLONOSCOPY  05/24/2011    diverticulosis/hemorrhoid   ??? COLONOSCOPY  2016    Colitis   ??? FINGER SURGERY      thumb   ??? KNEE SURGERY Left 01/2020   ??? PROSTATE SURGERY  12/17/2011    biopsy     Family History   Problem Relation Age of Onset   ??? Stroke Father    ??? Heart Disease Father      Outpatient Medications Marked as Taking for the 11/13/20 encounter (Office Visit) with Kennith Maes, MD   Medication Sig Dispense Refill   ??? NAPROXEN PO Take by mouth     ??? acetaminophen (TYLENOL 8 HOUR) 650 MG extended release tablet Tylenol Arthritis Pain 650 mg tablet,extended release   Take 2 tablets every 8 hours by oral route as needed.     ??? losartan (COZAAR) 50 MG tablet Take 50 mg by mouth daily     ??? loperamide (LOPERAMIDE A-D) 2 MG tablet Take 2 mg by mouth 4 times daily as needed for Diarrhea     ??? bismuth subsalicylate (PEPTO BISMOL) 262 MG chewable tablet Take 524 mg by mouth 4 times daily (before meals and nightly)     ??? Melatonin 10 MG CAPS Take by mouth      ??? Calcium Carbonate-Vitamin  D (CALCIUM-VITAMIN D) 500-200 MG-UNIT per tablet Take 1 tablet by mouth 2 times daily (with meals)     ??? sildenafil (VIAGRA) 100 MG tablet Take 1 tablet by mouth as needed for Erectile Dysfunction 12 tablet 3   ??? Glucosamine-Chondroitin (GLUCOSAMINE CHONDR COMPLEX PO) Take by mouth daily     ??? zolpidem (AMBIEN) 10 MG tablet Take 5 mg by mouth nightly as needed     ??? amitriptyline (ELAVIL) 50 MG tablet Take 1 tablet by mouth nightly. 90 tablet 0       Patient has no known allergies.  Social History     Tobacco Use   Smoking Status Former Smoker   ??? Packs/day: 0.50   ??? Years: 15.00   ??? Pack years: 7.50   ??? Start date: 09/16/1965   ??? Quit date: 06/23/1980   ??? Years since quitting: 40.4   Smokeless Tobacco Never Used      (If patient a smoker, smoking cessation counseling offered)   Social History     Substance and Sexual Activity   Alcohol Use Yes    Comment: Last drink Sept 29 2020- pt in AA       REVIEW OF SYSTEMS:  Constitutional: negative  Eyes: negative  Respiratory: negative  Cardiovascular: negative  Gastrointestinal: negative  Genitourinary: see HPI  Musculoskeletal: negative  Skin: negative   Neurological: negative  Hematological/Lymphatic: negative  Psychological: negative      Physical Exam:    This a 75 y.o. male  Vitals:    11/13/20 0849   BP: 130/72   Pulse: 60     There is no height or weight on file to calculate BMI.  Constitutional: Patient in no acute distress;         Assessment and Plan        1. Benign localized prostatic hyperplasia with lower urinary tract symptoms (LUTS)    2. Rising PSA level    3. Erectile dysfunction, unspecified erectile dysfunction type  Plan:       Rising PSA- discussed pirads 3 lesion. Discussed biopsy. Pt prefers to monitor. Reassess in six months with PSA  BPH- stable. no flomax.   ED- not interested in sildenafil for ED            Prescriptions Ordered:  No orders of the defined types were placed in this encounter.     Orders Placed:  No orders of the defined  types were placed in this encounter.           Idrissa Beville ALI Welton Flakes, MD

## 2021-01-29 NOTE — Telephone Encounter (Signed)
[facility] Colonoscopy Worksheet    Patient Name: Jay Lewis  DOB: [birth year]  Primary Care Physician: Dr. Shari Heritage Date: 01/29/2021    Surgery Location:   [] [place] []  [place] [x]  [facility] []  [place]    Why has a Colonoscopy been recommended for you? Due for 1, 7 years ago    To properly code your procedure we must ask the following questions.  PLEASE CHECK ALL THAT APPLY.     Are you currently having any of the following symptoms? No    []  Rectal Bleeding (K62.5) []  Bloody Stool (K92.1) []  Dark Tarry Stool (K92.1)  []  Fecal Occult Positive Blood (confirmed by blood test R19.5)  []  Anemia (low hemoglobin D64.9) []  Abdominal Pain (R10.84) []  Diarrhea (R19.7)    **If you have any of these symptoms your colonoscopy is diagnostic and must be coded as such. It will not be coded as a screening colonoscopy.     If you have no symptoms but have a personal history of polyps or a family history of colon cancer you fall in the high risk-screening category and we need to know more information.  If you have had a polyp or polyps removed at your last colonoscopy then the first scope after that is considered diagnostic.  Also if you have irritable bowel syndrome Chron's disease, diverticulitis or colon cancer then the scope would be a diagnostic colonoscopy.     Have you ever had a colonoscopy?   [x]  Yes  []  No    If so, where and when was that done? [place], 7 years    Was anything found during the last scope? Polyp    Was it removed? Yes    Do you have a family history of colon cancer? No      Have you personally been diagnosed with colon cancer? No    Any tobacco use?    []  Yes    [x]  No    Any alcohol use?     [x]  Yes    []  No  If yes, how often? Daily cocktail    In the last six (6) months have you experienced any of the following symptoms?   []  Blood from the rectum or stool  []  Abdominal Pain   []  Diarrhea  []  Itching of rectum   []  Vomiting  []  Constipation   []  Black  stools  []  Bloating   []  Mucous in your stool  []  Amenia   []  Change in bowel habits    Do you have allergies?   []  Yes  If yes, please list:   [x]  No    Do you take Warfarin, Coumadin, Plavix, Eliquis, Xarelto, or aspirin OR do you take a medication that thins your blood?  []  Yes  [x]  No    Please list all of your medications including over-the-counter and herbal supplements  Tylenol PRN Elavil   Pepto Prn Calcium and Vitamin D   Glucosamine-chondroitin loperamide   Losartan Melatonin   Naproxen Viagra   Ambien      List your past surgical procedures    colonoscopy Knee replacement   Rotator cuff repair Bicep repair                       Medical History  Do you have any history of:  []  None []  Heart Disease [x]  Hypertension  []  Diabetes []  Seizures []  Respiratory/Asthma  []  Sleep Apnea []  G.E.R.D []  Blood Disorder  []   Vascular Disease []  Depression    List the medical problems you are being treated for    Hypertension  Arthritis                            History of MRSA?  No        Have you check with your insurance company to see what you BENEFITS are id you have had a colonoscopy? If you have not check with them we encourage you to find this information out before the procedure. Below are codes you may need to five them.       CPT and Diagnosis: FOR OFFICE USE ONLY  [phone] Diagnostic colonoscopy- All patients Symptoms (check above or list):   [phone] Screening colonoscopy for NON-MEDICARE patients Diagnosis code: Z12.11  G0121 Screening colonoscopy for MEDICARE patients Diagnosis code: Z12.11  G0105 Screening colonoscopy for MEDICARE- HIGH RISK PATIENTS   Z85.038- Personal history malignant neoplasm, large intestine   Z85.048- Personal history malignant neoplasm, rectum/anus   Z86.010- Personal history colon polyps   Z80.0- Family history malignant neoplasm   Z83.79- Family history digestive disorder    Reviewed by nurse: Levonne Hubert LPN      SURGERY SCHEDULED FOR 03/02/2021        ADDITIONAL NOTES:

## 2021-01-30 NOTE — Telephone Encounter (Signed)
He had a normal colonoscopy in 2012 and a colonoscopy in 2015 that showed a solitary adenoma and collagenous colitis.  Reasonable to repeat colonoscopy at this time.

## 2021-03-02 LAB — CBC WITH AUTO DIFFERENTIAL
Basophils %: 1.812 (ref 0.0–3.0)
Basophils Absolute: 0.1143 (ref 0.0–0.3)
Eosinophils %: 9.199 (ref 0.0–10.0)
Eosinophils Absolute: 0.5805 (ref 0.0–1.1)
Hematocrit: 41.9 % — ABNORMAL LOW (ref 42.0–52.0)
Hemoglobin: 13.9 (ref 13.8–17.8)
Lymphocyte %: 17.49 — ABNORMAL LOW (ref 20–51.1)
Lymphocytes Absolute: 1.104 (ref 1.0–5.5)
MCH: 32.6 pg — ABNORMAL HIGH (ref 28.5–32.5)
MCHC: 33.2 g/dL (ref 32.0–37.0)
MCV: 98.2 fl — ABNORMAL HIGH (ref 80.0–94.0)
Monocytes %: 8.31 (ref 1.7–9.3)
Monocytes Absolute: 0.5245 (ref 0.1–1.0)
Neutrophils %: 63.19 (ref 42.2–75.2)
Neutrophils Absolute: 3.988 (ref 2.0–8.1)
Platelets: 167.6 10*3/uL (ref 130–400)
RBC: 4.27 M/uL — ABNORMAL LOW (ref 4.70–6.10)
RDW: 12.6 % (ref 10.0–15.5)
WBC: 6.3 Thou/mL3 (ref 4.8–10.8)

## 2021-03-02 LAB — BASIC METABOLIC PANEL
Anion Gap: 11.9 mmol/L
BUN: 20 mg/dL (ref 9–20)
CO2: 21 mmol/L — ABNORMAL LOW (ref 22–31)
Calcium: 8.7 mg/dL (ref 8.4–10.2)
Chloride: 109 mmol/L (ref 98–120)
Creatinine Clearance: 59.2778
Creatinine: 1.2 mg/dL (ref 0.7–1.3)
Gfr Calculated: >60
Glucose: 94 mg/dL (ref 75–110)
Potassium: 3.9 mmol/L (ref 3.6–5.0)
Sodium: 138 mmol/L (ref 135–145)

## 2021-03-02 LAB — TROPONIN I: Troponin I: 0.016 ng/mL (ref 0.000–0.03)

## 2021-04-09 DIAGNOSIS — I471 Supraventricular tachycardia, unspecified: Secondary | ICD-10-CM | POA: Insufficient documentation

## 2021-05-10 ENCOUNTER — Inpatient Hospital Stay: Admit: 2021-05-10 | Payer: MEDICARE | Primary: Internal Medicine

## 2021-05-10 DIAGNOSIS — N401 Enlarged prostate with lower urinary tract symptoms: Secondary | ICD-10-CM

## 2021-05-10 LAB — CREATININE
Creatinine: 1.24 mg/dL — ABNORMAL HIGH (ref 0.70–1.20)
GFR African American: >60 mL/min (ref >60)
GFR Non-African American: 57 mL/min — ABNORMAL LOW (ref >60)

## 2021-05-11 LAB — PSA PROSTATIC SPECIFIC ANTIGEN: PSA: 5.82 ng/mL — ABNORMAL HIGH (ref <4.1)

## 2021-05-14 ENCOUNTER — Ambulatory Visit: Admit: 2021-05-14 | Discharge: 2021-05-14 | Payer: MEDICARE | Attending: Urology | Primary: Internal Medicine

## 2021-05-14 DIAGNOSIS — N401 Enlarged prostate with lower urinary tract symptoms: Secondary | ICD-10-CM

## 2021-05-14 NOTE — Progress Notes (Signed)
[name], MD        [place]  [place] [id number]  Dept: [phone]  Dept Fax: [phone]  Loc: [phone]      [facility] Office Note -    Patient:  Jay Lewis  Date of Birth: [birth year]  Date: 05/14/2021    The patient is a 75 y.o. male who presents today for evaluation of the following problems:  BPH, PSA check  Chief Complaint   Patient presents with    Benign Prostatic Hypertrophy     6 months with psa    referred/consultation requested by [name], MD.    HISTORY OF PRESENT ILLNESS:     BPH  Happy with urinary stream and voiding. Stopped talking flomax recently  Urgency is actually improved  Recent knee surgery- was catheterized postop but now voiding well.       Elevated PSA  rising  Last Prostate MRI- PIRADS -3 transition zone lesions.      ED   "infrequent". Maintaining erection is an issue. Sildenafil PRN      Summary of Previous Records:  Pt with hx of melanoma. Getting skin treatments periodically  Hx of alcoholism. Recently started AA.      Requested/reviewed records from [name], MD office and/or outside physician/EMR    (Patient's old records have been requested, reviewed and pertinent findings summarized in today's note.)    Procedures Today: N/A      Last several PSA's:  Lab Results   Component Value Date    PSA 5.82 (H) 05/10/2021    PSA 4.64 (H) 10/13/2020    PSA 5.69 (H) 07/03/2020       Last total testosterone:  Lab Results   Component Value Date    TESTOSTERONE 489 11/22/2011       Urinalysis today:  No results found for this visit on 05/14/21.    Last BUN and creatinine:  Lab Results   Component Value Date    BUN 20 03/02/2021     Lab Results   Component Value Date    CREATININE 1.24 (H) 05/10/2021       Additional Lab/Culture results: none    Imaging Reviewed during this Office Visit:   Sylas Twombly ALI [name], MD independently reviewed the images and verified the radiology reports from:    1. No  findings suspicious for an immediate or high-grade neoplasia.  However   there are at least two discrete PI-RADS 3 lesions noted within the base   involving the transitional zones, which are otherwise considered equivocal.   There is also a 4 mm peripheral zone PI-RADS 2 lesion within the left   prostatic apex which does demonstrate washout kinetics and remains   indeterminate.   2. Mild sequela of prostatitis.   3. Colonic diverticulosis.         PAST MEDICAL, FAMILY AND SOCIAL HISTORY:  Past Medical History:   Diagnosis Date    Actinic keratosis     Anemia, mild     Anxiety     Chondromalacia     Collagenous colitis     Depression     Diverticulosis     Elevated blood pressure     GERD (gastroesophageal reflux disease)     Head trauma     Hyperlipidemia     Insomnia     Osteoarthritis     Right hip pain     Skin  cancer     Tachycardia     history of    Tremor     with right-sided weakness     Past Surgical History:   Procedure Laterality Date    COLONOSCOPY  05/24/2011    diverticulosis/hemorrhoid    COLONOSCOPY  2016    Colitis    FINGER SURGERY      thumb    KNEE SURGERY Left 01/2020    PROSTATE SURGERY  12/17/2011    biopsy     Family History   Problem Relation Age of Onset    Stroke Father     Heart Disease Father      Outpatient Medications Marked as Taking for the 05/14/21 encounter (Office Visit) with Kennith Maes, MD   Medication Sig Dispense Refill    NAPROXEN PO Take by mouth      acetaminophen (TYLENOL) 650 MG extended release tablet Tylenol Arthritis Pain 650 mg tablet,extended release   Take 2 tablets every 8 hours by oral route as needed.      losartan (COZAAR) 50 MG tablet Take 50 mg by mouth daily      loperamide (IMODIUM A-D) 2 MG tablet Take 2 mg by mouth 4 times daily as needed for Diarrhea      bismuth subsalicylate (PEPTO BISMOL) 262 MG chewable tablet Take 524 mg by mouth 4 times daily (before meals and nightly)      Melatonin 10 MG CAPS Take by mouth       Calcium Carbonate-Vitamin D  (CALCIUM-VITAMIN D) 500-200 MG-UNIT per tablet Take 1 tablet by mouth 2 times daily (with meals)      sildenafil (VIAGRA) 100 MG tablet Take 1 tablet by mouth as needed for Erectile Dysfunction 12 tablet 3    Glucosamine-Chondroitin (GLUCOSAMINE CHONDR COMPLEX PO) Take by mouth daily      zolpidem (AMBIEN) 10 MG tablet Take 5 mg by mouth nightly as needed      amitriptyline (ELAVIL) 50 MG tablet Take 1 tablet by mouth nightly. 90 tablet 0       Patient has no known allergies.  Social History     Tobacco Use   Smoking Status Former    Packs/day: 0.50    Years: 15.00    Pack years: 7.50    Types: Cigarettes    Start date: 09/16/1965    Quit date: 06/23/1980    Years since quitting: 40.9   Smokeless Tobacco Never      (If patient a smoker, smoking cessation counseling offered)   Social History     Substance and Sexual Activity   Alcohol Use Yes    Comment: Last drink Sept 29 2020- pt in AA       REVIEW OF SYSTEMS:  Constitutional: negative  Eyes: negative  Respiratory: negative  Cardiovascular: negative  Gastrointestinal: negative  Genitourinary: see HPI  Musculoskeletal: negative  Skin: negative   Neurological: negative  Hematological/Lymphatic: negative  Psychological: negative      Physical Exam:    This a 75 y.o. male  Vitals:    05/14/21 0858   BP: 136/78   Pulse: 78     Body mass index is 26.45 kg/m??.  Constitutional: Patient in no acute distress;         Assessment and Plan        1. Benign localized prostatic hyperplasia with lower urinary tract symptoms (LUTS)    2. Rising PSA level    3. Erectile dysfunction, unspecified erectile dysfunction type  Plan:       Rising PSA- discussed pirads 3 lesion. Discussed biopsy. Needs prostate biopsy fusion  BPH- stable. no flomax.   ED- sildenafil for ED      Pt will check with wife as to if he wants to do uronav in [place] (local in office) vs. Fusion guided biopsy mac in OR      Prescriptions Ordered:  No orders of the defined types were placed in this  encounter.     Orders Placed:  No orders of the defined types were placed in this encounter.           Cambell Rickenbach ALI [name], MD

## 2021-05-16 NOTE — Telephone Encounter (Signed)
Heather,   Please schedule patient for a fusion biopsy at [facility]. Thank you!

## 2021-05-17 ENCOUNTER — Inpatient Hospital Stay: Admit: 2021-05-17 | Primary: Internal Medicine

## 2021-05-17 ENCOUNTER — Encounter

## 2021-05-17 DIAGNOSIS — Z006 Encounter for examination for normal comparison and control in clinical research program: Secondary | ICD-10-CM

## 2021-05-17 NOTE — Telephone Encounter (Signed)
I will reach out to patient with a date and let you know so you can schedule his follow up.  Sorry it took a day, I had to have the MRI imported so we could see it.

## 2021-05-28 NOTE — Telephone Encounter (Signed)
Patient is scheduled for URONAV BX on 06/12/2021 at 1pm

## 2021-05-28 NOTE — Telephone Encounter (Signed)
error 

## 2021-05-28 NOTE — Telephone Encounter (Signed)
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  I will let you know when it is scheduled for sure tomorrow.

## 2021-05-28 NOTE — Telephone Encounter (Signed)
I have left a few messages with patient regarding scheduling URONAV BX; patient has not returned office phone calls.

## 2021-05-28 NOTE — Telephone Encounter (Addendum)
MRI FUSION GUIDED PROSTATE BIOPSY WITH DR [name]    FINTON QUINCEY      [birth year]    You are scheduled for the above procedure on:    07/10/2021   at:    9:30am  Your follow up appointment for your biopsy results is on:   [place] office will contact you for your follow up appointment.    Please note that you will be responsible for any charges that are not paid by your insurance.  The prostate biopsy specimens are sent to a Lab and their charges are billed to you separate from the office charges.      DESCRIPTION OF PROSTATIC ULTRASOUND AND BIOPSY  Ultrasound uses harmless sound waves to give [place] pictures of the prostate, and allows [place] to accurately guide a biopsy needle to areas of concern.  The procedure is done in the office.  Initially, a complete prostate exam is done.  Next the ultrasound probe, finger-like in size and shape, is placed into the rectum.  With slight movement of the probe, many different views are obtained.  A prostate block may or may not be given at this time.  A spring loaded fine needle is place through the probe and directed into the prostate.  Six or more biopsies are usually taken.  These biopsies are not usually painful.  The entire exam takes 20-30 minutes.    POSSIBLE RISKS OF THE PROCEDURE  Blood may be noted in the stool and urine for a few days.  Blood may be seen in the semen for up to a month.  Infection of the prostate or in the urine can occur even with antibiotic preparation.  You should call if you develop fever, chills, severe pain, or have continuous or significant bleeding.    If you have known hemorrhoids, you may have more bleeding and discomfort in the rectal area following the biopsy.  This may last for 3-5 days afterwards.  If any concerns arise, please call the office.    PREPARATION** PLEASE EAT A REGULAR LUNCH OR BREAKFAST**    1.  DO NOT TAKE: ASPIRIN, MOTRIN,  IBUPROFEN, MOBIC/ MELOXICAM, COUMADIN( WARFARIN) FISH OIL, AND VITAMIN E FOR 5 DAYS  BEFORE THE BIOPSY AND 3 DAYS AFTER THE BIOPSY.  YOU MAY TAKE TYLENOL IF NEEDED  2.  Take your antibiotics as directed:  Cipro 500 mg twice a day, start on:   07/09/2021    take until finished.    3.  Office to provide the Tobramycin an injectable antibiotic the day of the procedure .You will be given the injection once you are brought back to the room  4.  Do a Fleets Enema 2 hours before the visit.  Purchase it at any drug store and follow the instructions on the package.  5. Please ensure to bring a driver with you the day of the surgery to transport you home.    HOME GOING AND FOLLOW UP INSTRUCTIONS  Call the doctor if: 1.  There is a large amount of blood or clots in the urine or stool.           2.  You are unable to urinate.           3.  If your temperature is 101 degrees F or greater.           4.  You could see blood in your semen for up to 71-month  5.  You may resume your regular medication 3-days after procedure    DATE: 05/28/2021       SIGNATURE:________________________________

## 2021-05-29 MED ORDER — CIPROFLOXACIN HCL 500 MG PO TABS
500 MG | ORAL_TABLET | Freq: Two times a day (BID) | ORAL | 0 refills | Status: AC
Start: 2021-05-29 — End: 2021-06-01

## 2021-05-29 NOTE — Telephone Encounter (Signed)
Can you send the cipro for the biopsy? Thank you

## 2021-05-29 NOTE — Addendum Note (Signed)
Addended by: Janne Napoleon on: 05/29/2021 08:02 AM     Modules accepted: Orders

## 2021-05-29 NOTE — Telephone Encounter (Signed)
Patient advised cipro was sent to the pharmacy to start the day before the procedure. He voiced understanding.

## 2021-05-29 NOTE — Telephone Encounter (Signed)
Patient is scheduled for URONAV BX on 06/19/21 at 2pm

## 2021-05-29 NOTE — Telephone Encounter (Signed)
I sent cipro to the pharmacy.   Thanks

## 2021-06-11 NOTE — Telephone Encounter (Signed)
Patient requested that URONAV BX be rescheduled out a couple weeks from the 10/4 appt; preferably in the morning.  Patient has been rescheduled for his BX on 07/10/2021.

## 2021-06-12 ENCOUNTER — Encounter: Payer: MEDICARE | Attending: Urology | Primary: Internal Medicine

## 2021-06-19 ENCOUNTER — Encounter: Payer: MEDICARE | Attending: Urology | Primary: Internal Medicine

## 2021-07-09 ENCOUNTER — Encounter: Attending: Urology | Primary: Internal Medicine

## 2021-07-10 ENCOUNTER — Encounter: Payer: MEDICARE | Attending: Urology | Primary: Internal Medicine

## 2021-08-07 ENCOUNTER — Ambulatory Visit: Admit: 2021-08-07 | Discharge: 2021-08-07 | Payer: MEDICARE | Attending: Urology | Primary: Internal Medicine

## 2021-08-07 DIAGNOSIS — R972 Elevated prostate specific antigen [PSA]: Secondary | ICD-10-CM

## 2021-08-07 MED ORDER — TOBRAMYCIN SULFATE 80 MG/2ML IJ SOLN
80 MG/2ML | Freq: Once | INTRAMUSCULAR | Status: AC
Start: 2021-08-07 — End: 2021-08-07
  Administered 2021-08-07: 18:00:00 80 mg via INTRAMUSCULAR

## 2021-08-07 NOTE — Progress Notes (Signed)
Jay Lewis was seen in follow up for his prostate biopsy. The biopsy is being done with MRI / Ultrasound fusion using the UroNav system.  The biopsy was done without difficulty or complication.    TRUS prostate biopsy note:  After obtaining informed consent, the rectal ultrasound probe was passed per rectum and the prostate visualized.  A local block was performed by instilling 2% lidocaine at the base.  Measurements were taken for a total volume of 51 cc.    At this point, prostate biopsy was performed.  Using UroNav, the ultrasound and MRI images were fused and then biopsies of the lesions identified by the radiologist were taken.  Three cores were taken from each of the 3 lesions seen on MRI.  The rectal probe was removed and there was minimal bleeding.  Jay Lewis tolerated the procedure well and there were no complications.    Prostate size: 51 cc  Cores taken:  6  in addition to    3 cores each from 3 lesions    15 total cores  Complications: none      Assessment:      Prostate biopsy performed without difficulty.  This was done with UroNav MRI fused guidance.        Plan:        I will see Jay Lewis back to discuss the pathology in 1-2 weeks.  Further recommendations will be based on these results.

## 2021-08-07 NOTE — Telephone Encounter (Signed)
Left message for patient to return call to schedule follow up on 08/13/21 at 8am

## 2021-08-07 NOTE — Progress Notes (Signed)
Procedure: Trus/Biopsy  Pt name and birth date verified Yes  Patient agrees Dr. [name] is taking biopsies of the prostate Yes  Patient took Enema 2 hours prior to procedure Yes  Patient took 2 pill(s) of Cipro day before procedure, day of, and will the day after Yes  Has patient eaten today?  No  Patient stopped all blood thinners prior to surgery Yes       Patient has given me verbal consent to perform Tobramycin Injection  Yes    Following Dr. [name] plan of care.  Tobramycin 80MG /2ML GIVEN I.M right UOQ HIP  Lot Number: [id number]  Expiration Date: 01/14/2022  [facility] #: [id number]      Tobramycin supplied by office.

## 2021-08-07 NOTE — Telephone Encounter (Signed)
URONAV BX done today, 08/07/21, patient needs scheduled for appointment to review results in [place].  Thank you.

## 2021-08-13 ENCOUNTER — Ambulatory Visit: Payer: MEDICARE | Attending: Urology | Primary: Internal Medicine

## 2021-08-20 ENCOUNTER — Ambulatory Visit: Admit: 2021-08-20 | Discharge: 2021-08-20 | Payer: MEDICARE | Attending: Urology | Primary: Internal Medicine

## 2021-08-20 DIAGNOSIS — C61 Malignant neoplasm of prostate: Secondary | ICD-10-CM

## 2021-08-20 NOTE — Progress Notes (Signed)
[name], MD        [place]  [place] [address]  Dept: [phone]  Dept Fax: [phone]  Loc: [phone]      [facility] Note -    Patient:  Jay Lewis  Date of Birth: [birth year]  Date: 08/20/2021    The patient is a 75 y.o. male who presents today for evaluation of the following problems:  BPH, PSA check  Chief Complaint   Patient presents with    Elevated PSA     Prostate bx results     referred/consultation requested by [name], MD.    HISTORY OF PRESENT ILLNESS:     Prostate cancer  FINAL DIAGNOSIS:   A-C, F-I.  Prostate, right (apex, mid and base) and left (mid and   base), lesion #1, lesion #2 and lesion #3, core needle biopsies:     No evidence of malignancy.   D.  Prostate, left apex, core needle biopsy:     Invasive prostatic adenocarcinoma.     Gleason score: 3+3 = 6 (grade group 1).     Tumor volume: 50% (1 of 1 core, 7 of 14 mm).     BPH  Happy with urinary stream and voiding. Stopped talking flomax recently  Urgency is actually improved  Recent knee surgery- was catheterized postop but now voiding well.     ED   "infrequent". Maintaining erection is an issue. Sildenafil PRN      Summary of Previous Records:  Pt with hx of melanoma. Getting skin treatments periodically  Hx of alcoholism. Recently started AA.      Requested/reviewed records from [name], MD office and/or outside physician/EMR    (Patient's old records have been requested, reviewed and pertinent findings summarized in today's note.)    Procedures Today: N/A      Last several PSA's:  Lab Results   Component Value Date    PSA 5.82 (H) 05/10/2021    PSA 4.64 (H) 10/13/2020    PSA 5.69 (H) 07/03/2020       Last total testosterone:  Lab Results   Component Value Date    TESTOSTERONE 489 11/22/2011       Urinalysis today:  No results found for this visit on 08/20/21.    Last BUN and creatinine:  Lab Results   Component Value Date    BUN  20 03/02/2021     Lab Results   Component Value Date    CREATININE 1.24 (H) 05/10/2021       Additional Lab/Culture results: none    Imaging Reviewed during this Office Visit:   Garth Diffley ALI [name], MD independently reviewed the images and verified the radiology reports from:    1. No findings suspicious for an immediate or high-grade neoplasia.  However   there are at least two discrete PI-RADS 3 lesions noted within the base   involving the transitional zones, which are otherwise considered equivocal.   There is also a 4 mm peripheral zone PI-RADS 2 lesion within the left   prostatic apex which does demonstrate washout kinetics and remains   indeterminate.   2. Mild sequela of prostatitis.   3. Colonic diverticulosis.         PAST MEDICAL, FAMILY AND SOCIAL HISTORY:  Past Medical History:   Diagnosis Date    Actinic keratosis     Anemia, mild     Anxiety  Chondromalacia     Collagenous colitis     Depression     Diverticulosis     Elevated blood pressure     GERD (gastroesophageal reflux disease)     Head trauma     Hyperlipidemia     Insomnia     Osteoarthritis     Right hip pain     Skin cancer     Tachycardia     history of    Tremor     with right-sided weakness     Past Surgical History:   Procedure Laterality Date    COLONOSCOPY  05/24/2011    diverticulosis/hemorrhoid    COLONOSCOPY  2016    Colitis    FINGER SURGERY      thumb    KNEE SURGERY Left 01/2020    PROSTATE SURGERY  12/17/2011    biopsy     Family History   Problem Relation Age of Onset    Stroke Father     Heart Disease Father      Outpatient Medications Marked as Taking for the 08/20/21 encounter (Office Visit) with Kennith Maes, MD   Medication Sig Dispense Refill    NAPROXEN PO Take by mouth      acetaminophen (TYLENOL) 650 MG extended release tablet Tylenol Arthritis Pain 650 mg tablet,extended release   Take 2 tablets every 8 hours by oral route as needed.      losartan (COZAAR) 50 MG tablet Take 50 mg by mouth daily      loperamide (IMODIUM A-D)  2 MG tablet Take 2 mg by mouth 4 times daily as needed for Diarrhea      bismuth subsalicylate (PEPTO BISMOL) 262 MG chewable tablet Take 524 mg by mouth 4 times daily (before meals and nightly)      Melatonin 10 MG CAPS Take by mouth       Calcium Carbonate-Vitamin D (CALCIUM-VITAMIN D) 500-200 MG-UNIT per tablet Take 1 tablet by mouth 2 times daily (with meals)      sildenafil (VIAGRA) 100 MG tablet Take 1 tablet by mouth as needed for Erectile Dysfunction 12 tablet 3    Glucosamine-Chondroitin (GLUCOSAMINE CHONDR COMPLEX PO) Take by mouth daily      zolpidem (AMBIEN) 10 MG tablet Take 5 mg by mouth nightly as needed      amitriptyline (ELAVIL) 50 MG tablet Take 1 tablet by mouth nightly. 90 tablet 0       Patient has no known allergies.  Social History     Tobacco Use   Smoking Status Former    Packs/day: 0.50    Years: 15.00    Pack years: 7.50    Types: Cigarettes    Start date: 09/16/1965    Quit date: 06/23/1980    Years since quitting: 41.1   Smokeless Tobacco Never      (If patient a smoker, smoking cessation counseling offered)   Social History     Substance and Sexual Activity   Alcohol Use Yes    Comment: Last drink Sept 29 2020- pt in AA       REVIEW OF SYSTEMS:  Constitutional: negative  Eyes: negative  Respiratory: negative  Cardiovascular: negative  Gastrointestinal: negative  Genitourinary: see HPI  Musculoskeletal: negative  Skin: negative   Neurological: negative  Hematological/Lymphatic: negative  Psychological: negative      Physical Exam:    This a 75 y.o. male  Vitals:    08/20/21 1148   BP: 124/72   Pulse: 77  SpO2: 99%     Body mass index is 25.9 kg/m??.  Constitutional: Patient in no acute distress;         Assessment and Plan        1. Prostate cancer ([place])    2. Benign localized prostatic hyperplasia with lower urinary tract symptoms (LUTS)    3. Erectile dysfunction, unspecified erectile dysfunction type               Plan:       Prostate cancer- start active surveillance. Father passed  from prostate cancer.   BPH- stable. no flomax.   ED- stable. sildenafil for ED      [facility] and follow up in two months. PSA in six months. Will need confirmation biopsy in one year.       Prescriptions Ordered:  No orders of the defined types were placed in this encounter.     Orders Placed:  Orders Placed This Encounter   Procedures    PSA, Prostatic Specific Antigen     Standing Status:   Future     Standing Expiration Date:   02/19/2023              [name], MD

## 2021-08-28 NOTE — Telephone Encounter (Signed)
Spoke with Jay Lewis with [facility]. They were not able to analyze the specimen due to the tumor length not being big enough and only having one positive core. When the pathologist is done with report they will fax it.

## 2021-08-28 NOTE — Telephone Encounter (Signed)
[facility] called stating the sample for this patient could not be run due to insufficient tumor.  They will fax this information to [place].

## 2021-10-22 ENCOUNTER — Encounter: Payer: MEDICARE | Attending: Urology | Primary: Internal Medicine

## 2021-11-29 DIAGNOSIS — R001 Bradycardia, unspecified: Secondary | ICD-10-CM | POA: Insufficient documentation

## 2021-11-29 DIAGNOSIS — K52831 Collagenous colitis: Secondary | ICD-10-CM | POA: Insufficient documentation

## 2021-11-29 DIAGNOSIS — I129 Hypertensive chronic kidney disease with stage 1 through stage 4 chronic kidney disease, or unspecified chronic kidney disease: Secondary | ICD-10-CM | POA: Insufficient documentation

## 2021-12-18 ENCOUNTER — Other Ambulatory Visit: Payer: Self-pay | Admitting: Orthopedic Surgery

## 2021-12-18 DIAGNOSIS — Z96652 Presence of left artificial knee joint: Secondary | ICD-10-CM

## 2021-12-18 DIAGNOSIS — M25562 Pain in left knee: Secondary | ICD-10-CM

## 2021-12-20 ENCOUNTER — Encounter: Payer: Self-pay | Admitting: Urology

## 2021-12-20 ENCOUNTER — Ambulatory Visit: Payer: Medicare HMO | Admitting: Urology

## 2021-12-20 ENCOUNTER — Telehealth: Payer: Self-pay

## 2021-12-20 VITALS — BP 121/63 | HR 68 | Ht 71.38 in | Wt 180.8 lb

## 2021-12-20 DIAGNOSIS — C61 Malignant neoplasm of prostate: Secondary | ICD-10-CM | POA: Diagnosis not present

## 2021-12-20 DIAGNOSIS — N529 Male erectile dysfunction, unspecified: Secondary | ICD-10-CM | POA: Diagnosis not present

## 2021-12-20 NOTE — Telephone Encounter (Signed)
Records faxed to [facility]

## 2021-12-20 NOTE — Progress Notes (Signed)
? ?  12/20/21 ?9:44 AM  ? ?[name] ?02-28-1946 ?[id number] ? ?CC: Prostate cancer, ED ? ?HPI: ?76 year old male with reported low risk prostate cancer that has been followed for the last few years in [place].  He recently moved to the area.  He reportedly had a slowly rising PSA that prompted a prostate MRI, and underwent a MRI fusion biopsy.  He reportedly had 2 cores positive for low risk 3+3= 6 disease, and opted for active surveillance.  Most recent PSA on 11/29/2021 is 6.19, no prior values or outside records to review.  He denies any urinary symptoms, otherwise very healthy. ? ?He has ED that is responsive to sildenafil 25 to 50 mg as needed. ? ?He has a family history of prostate cancer in his father, unclear if this was lethal. ? ? ?PMH: ?Past Medical History:  ?Diagnosis Date  ? Collagenous colitis   ? Hypertension   ? Insomnia   ? Prostate cancer ([place])   ? Sinus bradycardia   ? ? ?Social History:  has no history on file for tobacco use, alcohol use, and drug use. ? ?Physical Exam: ?BP 121/63 (BP Location: Left Arm, Patient Position: Sitting, Cuff Size: Normal)   Pulse 68   Ht 5' 11.38" (1.813 m)   Wt 180 lb 12.8 oz (82 kg)   BMI 24.95 kg/m?   ? ?Constitutional:  Alert and oriented, No acute distress. ?Cardiovascular: No clubbing, cyanosis, or edema. ?Respiratory: Normal respiratory effort, no increased work of breathing. ?GI: Abdomen is soft, nontender, nondistended, no abdominal masses ?DRE: 50 g, smooth, no nodules or masses ? ?Laboratory Data: ?Reviewed, see HPI ? ?Assessment & Plan:   ?76 year old healthy male who recently moved to the area who had been followed for low risk prostate cancer in [place] over the last few years, as well as ED responsive to sildenafil.  We will work to obtain his outside records and pathology, as well as prior PSA values.  He thinks his PSA has been stable around 6.  DRE benign today. ? ?-Continue active surveillance for low risk prostate cancer, RTC 1 year with PSA  prior ?-We will obtain outside records ?-Continue sildenafil for ED, okay to refill as needed ? ?Nickolas Madrid, MD ?12/20/2021 ? ?[facility] ?[address], [address] ?[place], [place] [address] ?([phone] ? ? ?

## 2021-12-20 NOTE — Addendum Note (Signed)
Addended by: Donalee Citrin on: 12/20/2021 09:51 AM ? ? Modules accepted: Orders ? ?

## 2021-12-20 NOTE — Telephone Encounter (Signed)
Medical release form completed and faxed to [facility] in [place], [place] office of Dr. [name] at [phone].  ?

## 2021-12-25 ENCOUNTER — Encounter
Admission: RE | Admit: 2021-12-25 | Discharge: 2021-12-25 | Disposition: A | Discharge status: Home / Self Care | Payer: Medicare HMO | Source: Ambulatory Visit | Attending: Orthopedic Surgery | Admitting: Orthopedic Surgery

## 2021-12-25 DIAGNOSIS — Z96652 Presence of left artificial knee joint: Secondary | ICD-10-CM | POA: Insufficient documentation

## 2021-12-25 DIAGNOSIS — M25562 Pain in left knee: Secondary | ICD-10-CM | POA: Insufficient documentation

## 2021-12-25 MED ORDER — TECHNETIUM TC 99M MEDRONATE IV KIT
20.0000 | PACK | Freq: Once | INTRAVENOUS | Status: AC | PRN
  Administered 2021-12-25: 20.69 via INTRAVENOUS

## 2021-12-28 ENCOUNTER — Ambulatory Visit
Admission: RE | Admit: 2021-12-28 | Disposition: A | Discharge status: Home / Self Care | Payer: Medicare HMO | Source: Ambulatory Visit

## 2021-12-28 ENCOUNTER — Ambulatory Visit: Payer: Medicare HMO | Attending: Orthopedic Surgery

## 2022-01-09 ENCOUNTER — Encounter
Admission: RE | Admit: 2022-01-09 | Discharge: 2022-01-09 | Disposition: A | Discharge status: Home / Self Care | Payer: Medicare HMO | Source: Ambulatory Visit | Attending: Orthopedic Surgery | Admitting: Orthopedic Surgery

## 2022-01-09 DIAGNOSIS — M25562 Pain in left knee: Secondary | ICD-10-CM | POA: Insufficient documentation

## 2022-01-09 DIAGNOSIS — Z96652 Presence of left artificial knee joint: Secondary | ICD-10-CM | POA: Insufficient documentation

## 2022-01-09 MED ORDER — TECHNETIUM TC 99M MEDRONATE IV KIT
20.0000 | PACK | Freq: Once | INTRAVENOUS | Status: AC | PRN
  Administered 2022-01-09: 21.7 via INTRAVENOUS

## 2022-02-18 ENCOUNTER — Ambulatory Visit: Payer: MEDICARE | Attending: Urology | Primary: Internal Medicine

## 2022-03-03 DIAGNOSIS — R39198 Other difficulties with micturition: Secondary | ICD-10-CM | POA: Insufficient documentation

## 2022-03-05 ENCOUNTER — Ambulatory Visit: Payer: Medicare HMO | Admitting: Urology

## 2022-03-05 ENCOUNTER — Telehealth: Payer: Self-pay | Admitting: Urology

## 2022-03-05 ENCOUNTER — Encounter: Payer: Self-pay | Admitting: Urology

## 2022-03-05 VITALS — BP 158/62 | HR 64 | Ht 71.38 in | Wt 193.6 lb

## 2022-03-05 DIAGNOSIS — R339 Retention of urine, unspecified: Secondary | ICD-10-CM

## 2022-03-05 DIAGNOSIS — R39198 Other difficulties with micturition: Secondary | ICD-10-CM | POA: Diagnosis not present

## 2022-03-05 DIAGNOSIS — Z8546 Personal history of malignant neoplasm of prostate: Secondary | ICD-10-CM | POA: Diagnosis not present

## 2022-03-05 LAB — BLADDER SCAN AMB NON-IMAGING

## 2022-03-05 MED ORDER — TAMSULOSIN HCL 0.4 MG PO CAPS: 0.4000 mg | ORAL_CAPSULE | Freq: Every day | ORAL | 0 refills | Status: DC

## 2022-03-05 NOTE — Telephone Encounter (Signed)
Spoke with patient and scheduled appt today. Possible urine retention, after hip surgery on Thursday 02/28/2022.

## 2022-03-05 NOTE — Progress Notes (Signed)
   03/05/2022 4:09 PM   [name] 08-Aug-1946 [id number]  Reason for visit: Urinary retention  HPI: 76 year old male who recently moved to the area from [place] and I have followed for low risk prostate cancer on active surveillance.  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  He was added to clinic today with difficulty urinating after having a hip replaced 5 days ago.  Bladder scan elevated at greater than 1 L.  We discussed options including Foley placement or CIC.  He opted for Foley placement.  Foley was placed with return of 1400 mL yellow urine, see note for details of procedure.  Flomax 0.4 mg nightly x30 days, RTC 1 week follow-up for void trial and PVR  Billey Co, MD  [facility] [address] [place], [place] [address] [phone]

## 2022-03-05 NOTE — Telephone Encounter (Signed)
Pt just had hip surgery and is having trouble urinating. Would like to be seen

## 2022-03-05 NOTE — Progress Notes (Signed)
Simple Catheter Placement  Due to urinary retention patient is present today for a foley cath placement.  Patient was cleaned and prepped in a sterile fashion with betadine. A  18FR Coude foley catheter was inserted, urine return was noted 1431m, urine was dark yellow in color. The balloon was filled with 10cc of sterile water. A leg bag was attached for drainage. Patient was also given a night bag to take home and was given instruction on how to change from one bag to another.  Patient was given instruction on proper catheter care. Patient tolerated well, no complications were noted.  Performed by: OGordy Clement CMA (AAMA)  Additional notes/ Follow up: RTC in 1 week for voiding trial w/ PA

## 2022-03-12 ENCOUNTER — Ambulatory Visit: Payer: Medicare HMO | Admitting: Physician Assistant

## 2022-03-12 ENCOUNTER — Encounter: Payer: Self-pay | Admitting: Physician Assistant

## 2022-03-12 DIAGNOSIS — R339 Retention of urine, unspecified: Secondary | ICD-10-CM

## 2022-03-12 LAB — BLADDER SCAN AMB NON-IMAGING

## 2022-03-13 ENCOUNTER — Ambulatory Visit: Payer: Medicare HMO | Admitting: Physician Assistant

## 2022-03-13 ENCOUNTER — Encounter: Payer: Self-pay | Admitting: Physician Assistant

## 2022-03-13 VITALS — BP 162/70 | HR 60 | Ht 72.0 in | Wt 185.0 lb

## 2022-03-13 DIAGNOSIS — R339 Retention of urine, unspecified: Secondary | ICD-10-CM | POA: Diagnosis not present

## 2022-03-13 LAB — BLADDER SCAN AMB NON-IMAGING

## 2022-03-13 NOTE — Patient Instructions (Addendum)
Cystoscopy Cystoscopy is a procedure that is used to help diagnose and sometimes treat conditions that affect the lower urinary tract. The lower urinary tract includes the bladder and the urethra. The urethra is the tube that drains urine from the bladder. Cystoscopy is done using a thin, tube-shaped instrument with a light and camera at the end (cystoscope). The cystoscope may be hard or flexible, depending on the goal of the procedure. The cystoscope is inserted through the urethra, into the bladder. Cystoscopy may be recommended if you have: Urinary tract infections that keep coming back. Blood in the urine (hematuria). An inability to control when you urinate (urinary incontinence) or an overactive bladder. Unusual cells found in a urine sample. A blockage in the urethra, such as a urinary stone. Painful urination. An abnormality in the bladder found during an intravenous pyelogram (IVP) or CT scan. What are the risks? Generally, this is a safe procedure. However, problems may occur, including: Infection. Bleeding.  What happens during the procedure?  You will be given one or more of the following: A medicine to numb the area (local anesthetic). The area around the opening of your urethra will be cleaned. The cystoscope will be passed through your urethra into your bladder. Germ-free (sterile) fluid will flow through the cystoscope to fill your bladder. The fluid will stretch your bladder so that your health care provider can clearly examine your bladder walls. Your doctor will look at the urethra and bladder. The cystoscope will be removed The procedure may vary among health care providers  What can I expect after the procedure? After the procedure, it is common to have: Some soreness or pain in your urethra. Urinary symptoms. These include: Mild pain or burning when you urinate. Pain should stop within a few minutes after you urinate. This may last for up to a few days after the  procedure. A small amount of blood in your urine for several days. Feeling like you need to urinate but producing only a small amount of urine. Follow these instructions at home: General instructions Return to your normal activities as told by your health care provider.  Drink plenty of fluids after the procedure. Keep all follow-up visits as told by your health care provider. This is important. Contact a health care provider if you: Have pain that gets worse or does not get better with medicine, especially pain when you urinate lasting longer than 72 hours after the procedure. Have trouble urinating. Get help right away if you: Have blood clots in your urine. Have a fever or chills. Are unable to urinate. Summary Cystoscopy is a procedure that is used to help diagnose and sometimes treat conditions that affect the lower urinary tract. Cystoscopy is done using a thin, tube-shaped instrument with a light and camera at the end. After the procedure, it is common to have some soreness or pain in your urethra. It is normal to have blood in your urine after the procedure.  If you were prescribed an antibiotic medicine, take it as told by your health care provider.  This information is not intended to replace advice given to you by your health care provider. Make sure you discuss any questions you have with your health care provider. Document Revised: 08/25/2018 Document Reviewed: 08/25/2018 Elsevier Patient Education  2020 Elsevier Inc.   Transrectal Ultrasound  A transrectal ultrasound is a procedure that uses sound waves to create images of the prostate gland and nearby tissues. For this procedure, an ultrasound probe is placed   rectum. The probe sends sound waves through the wall of the rectum into the prostate gland. The prostate is a walnut-sized gland that is located below the bladder and in front of the rectum. The images show the size and shape of the prostate gland and nearby  structures. You may need this test if you have: Trouble urinating. Trouble getting your partner pregnant (infertility). An abnormal result from a prostate screening exam. Tell a health care provider about: Any allergies you have. All medicines you are taking, including vitamins, herbs, eye drops, creams, and over-the-counter medicines. Any bleeding problems you have. Any surgeries you have had. Any medical conditions you have. Any prostate infections you have had. What are the risks? Generally, this is a safe procedure. However, problems may occur, including: Discomfort during the procedure. Blood in your urine or sperm after the procedure. This may occur if a sample of tissue is taken to look at under a microscope (biopsy) during the procedure. What happens before the procedure? Your health care provider may instruct you to use an enema 1-4 hours before the procedure. Follow instructions from your health care provider about how to do the enema. Ask your health care provider about: Changing or stopping your regular medicines. This is especially important if you are taking diabetes medicines or blood thinners. Taking medicines such as aspirin and ibuprofen. These medicines can thin your blood. Do not take these medicines unless your health care provider tells you to take them. Taking over-the-counter medicines, vitamins, herbs, and supplements. What happens during the procedure? You will be asked to lie down on your left side on an exam table. You will bend your knees toward your chest. Gel will be put on a small probe that is about the width of a finger. The probe will be gently inserted into your rectum. You may have a feeling of fullness but should not feel pain. The probe will send signals to a computer that will create images. These will be displayed on a monitor that looks like a small television screen. The technician will slightly rotate the probe throughout the procedure. While  rotating the probe, he or she will view and capture images of the prostate gland and the surrounding structures from different angles. Your health care provider may take a biopsy sample of prostate tissue during the procedure. The images captured from the ultrasound will help guide the needle that is used to remove a sample of tissue. The sample will be sent to a lab for testing. The probe will be removed. The procedure may vary among health care providers and hospitals. What can I expect after the procedure? It is up to you to get the results of your procedure. Ask your health care provider, or the department that is doing the procedure, when your results will be ready. Keep all follow-up visits. This is important. Summary A transrectal ultrasound is a procedure that uses sound waves to create images of the prostate gland and nearby tissues. The images show the size and shape of the prostate gland and nearby structures. Before the procedure, ask your health care provider about changing or stopping your regular medicines. This is especially important if you are taking diabetes medicines or blood thinners. This information is not intended to replace advice given to you by your health care provider. Make sure you discuss any questions you have with your health care provider. Document Revised: 05/16/2021 Document Reviewed: 02/26/2021 Elsevier Patient Education  [place].     Step  1 Get all of your supplies ready and place near you. Step 2 Wash your hands, or put on gloves. Step 3 Wash around the tip of your penis with warm antibacterial soapy water. Step 4 Take catheter out of package and drain the lubricant over toilet. Step 5 While holding the penis at a 45 degree angle from the stomach in one hand and the catheter in the other hand  Step 6 Insert the catheter slowly into your urethra. If there is resistance when the catheter reaches the sphincter muscle,              take a deep  breath and gently apply steady pressure.              DO NOT FORCE THE CATHETER Step 7 When the urine begins to flow insert another inch and lower penis. Allow the urine to flow into the toilet. Step 8 When the flow of urine stops, slowly remove the catheter.

## 2022-03-13 NOTE — Progress Notes (Signed)
03/13/2022 11:59 AM   Alejandro Stuart [birth year] [id number]  CC: Chief Complaint  Patient presents with   Urinary Retention    Follow up   HPI: Alejandro Stuart is a 76 y.o. 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  He is accompanied today by his wife, who contributes to HPI.  Today he reports he has voided several times overnight.  He is not having any pain or difficulty voiding.  PVR >828m.  He remains on Flomax.  His wife reports that she is a retired nMarine scientistand is very comfortable with I&O catheterization.   PMH: Past Medical History:  Diagnosis Date   Collagenous colitis    Hypertension    Insomnia    [place]    Sinus bradycardia     Surgical History: Past Surgical History:  Procedure Laterality Date   KNEE ARTHROSCOPY      Home Medications:  Allergies as of 03/13/2022       Reactions   Statins    Other reaction(s): Liver Disorder Abnl lft's        Medication List        Accurate as of March 13, 2022 11:59 AM. If you have any questions, ask your nurse or doctor.          amitriptyline 50 MG tablet Commonly known as: ELAVIL Take 50 mg by mouth at bedtime.   aspirin 81 MG chewable tablet CHEW AND SWALLOW 1 TABLET BY MOUTH TWICE DAILY   CITRACAL +D3 PO Citracal-D3 Slow Release 600 mg calc-12.5 mcg (500 unit)tablet,ext rel  Take 2 tablets by oral route.  takes 4 x a week   EQ Senna-S 8.6-50 MG tablet Generic drug: senna-docusate TAKE 2 TABLETS BY MOUTH IN THE MORNING   GLUCOSAMINE CHONDROIT MSM DS PO Glucosam-Chond-MSM(with boron)  Take 2 tablets daily   losartan 50 MG tablet Commonly known as: COZAAR Take 50 mg by mouth daily.   oxyCODONE 5 MG immediate  release tablet Commonly known as: Oxy IR/ROXICODONE oxycodone 5 mg tablet  Take 1 tablet every 4 hours by oral route.   sildenafil 50 MG tablet Commonly known as: VIAGRA Take 50 mg by mouth as needed. 1/2 - 1 tablet by mouth as needed prior to intercourse   tamsulosin 0.4 MG Caps capsule Commonly known as: FLOMAX Take 1 capsule (0.4 mg total) by mouth daily.   UNABLE TO FIND fluorouracil 5% + calcipotriene 0.005% Apply thin layer twice daily to scalp for 3-5 days as directed   zolpidem 10 MG tablet Commonly known as: AMBIEN Take 5 mg by mouth at bedtime. Take 1/2 tablet by mouth nightly        Allergies:  Allergies  Allergen Reactions   Statins     Other reaction(s): Liver Disorder Abnl lft's    Family History: No family history on file.  Social History:   reports that he quit smoking about 35 years ago. His smoking use included cigarettes. He has never used smokeless tobacco. He reports current alcohol use of about 2.0 standard drinks of alcohol per week. He reports that he does not use drugs.  Physical Exam: BP (!) 162/70   Pulse 60   Ht 6' (1.829 m)   Wt 185 lb (83.9 kg)   BMI 25.09 kg/m  Constitutional:  Alert and oriented, no acute distress, nontoxic appearing HEENT: [place], AT Cardiovascular: No clubbing, cyanosis, or edema Respiratory: Normal respiratory effort, no increased work of breathing Skin: No rashes, bruises or suspicious lesions Neurologic: Grossly intact, no focal deficits, moving all 4 extremities Psychiatric: Normal mood and affect  Laboratory Data: Results for orders placed or performed in visit on 03/13/22  BLADDER SCAN AMB NON-IMAGING  Result Value Ref Range   Scan Result >873m    Assessment & Plan:   1. Urinary retention Likely acute on chronic in the setting of recent hip replacement with underlying BPH/prostate cancer.  We discussed various options at this point including Foley catheter replacement and CIC.  We discussed the need  to proceed with cystoscopy and TRUS for further evaluation of possible bladder outlet obstruction.  He wishes to proceed with twice daily CIC performed by his wife.  I oriented them to catheters and sent home with samples today.  They watched the self-catheterization video and wife again reiterates that she feels very comfortable performing this at home. - BLADDER SCAN AMB NON-IMAGING  Return in 1 week (on 03/20/2022) for Cysto and TRUS with Dr. SDiamantina Providence  SDebroah Loop PA-C  [facility] [address] [place] [phone]

## 2022-03-20 ENCOUNTER — Ambulatory Visit: Payer: Medicare HMO | Admitting: Urology

## 2022-03-20 VITALS — BP 131/64 | HR 61

## 2022-03-20 DIAGNOSIS — R339 Retention of urine, unspecified: Secondary | ICD-10-CM

## 2022-03-20 DIAGNOSIS — N401 Enlarged prostate with lower urinary tract symptoms: Secondary | ICD-10-CM | POA: Diagnosis not present

## 2022-03-20 DIAGNOSIS — R39198 Other difficulties with micturition: Secondary | ICD-10-CM

## 2022-03-20 DIAGNOSIS — C61 Malignant neoplasm of prostate: Secondary | ICD-10-CM

## 2022-03-20 DIAGNOSIS — Z8546 Personal history of malignant neoplasm of prostate: Secondary | ICD-10-CM | POA: Diagnosis not present

## 2022-03-20 DIAGNOSIS — R3989 Other symptoms and signs involving the genitourinary system: Secondary | ICD-10-CM

## 2022-03-20 DIAGNOSIS — N138 Other obstructive and reflux uropathy: Secondary | ICD-10-CM | POA: Diagnosis not present

## 2022-03-20 LAB — URINALYSIS, COMPLETE
Bilirubin, UA: NEGATIVE
Glucose, UA: NEGATIVE
Ketones, UA: NEGATIVE
Nitrite, UA: POSITIVE — AB
Protein,UA: NEGATIVE
Specific Gravity, UA: 1.015 (ref 1.005–1.030)
Urobilinogen, Ur: 0.2 mg/dL (ref 0.2–1.0)
pH, UA: 5.5 (ref 5.0–7.5)

## 2022-03-20 LAB — MICROSCOPIC EXAMINATION: WBC, UA: >30 /hpf — AB (ref 0–5)

## 2022-03-20 MED ORDER — TAMSULOSIN HCL 0.4 MG PO CAPS: 0.4000 mg | ORAL_CAPSULE | Freq: Every day | ORAL | 11 refills | Status: DC

## 2022-03-20 MED ORDER — CEPHALEXIN 500 MG PO CAPS: 500.0000 mg | ORAL_CAPSULE | Freq: Two times a day (BID) | ORAL | 0 refills | Status: AC

## 2022-03-20 NOTE — Progress Notes (Signed)
   03/20/2022 3:30 PM   Alejandro Stuart [birth year] [id number]  Reason for visit: Follow up urinary retention, BPH, low risk prostate cancer  HPI: 76 year old male who I originally met in April 2023 to establish care for very low risk prostate cancer that was diagnosed in [place].  PSA has been stable in the 5-6 range.  He was seen in clinic on 03/05/2022 with difficulty urinating after having a hip replaced and bladder scan was elevated greater than 1 L and a Foley was placed.  He then failed a voiding trial 1 week later with persistently elevated PVRs of 700 mL, and was started on intermittent catheterization.  Over the last few days he has improved significantly and catheterization postvoid volumes have decreased to ~225ml.  He is able to void spontaneously, and PVR today is 273ml.  He would like to defer cystoscopy and TRUS which is reasonable with his improved voiding and PVRs.  We discussed the complexity of managing BPH and incomplete bladder emptying in the setting of his low risk prostate cancer.  His prostate measured 60 g on MRI from November 2022 from [place] that was performed for the mildly elevated PSA.  We briefly discussed options including UroLift or HOLEP(BPH procedures), as well as more invasive options like robotic radical prostatectomy(cancer procedure).  Using shared decision making, he opted to continue the Flomax and timed voiding, and understands return precautions at length.  -Flomax refilled -Urinalysis suspicious for infection, sent for culture and started on Keflex -RTC 8 weeks PVR, sooner if worsening urinary symptoms    Billey Co, MD  [facility] [address] [place], [place] [address] [phone]

## 2022-03-22 ENCOUNTER — Other Ambulatory Visit: Payer: Self-pay | Admitting: Family Medicine

## 2022-03-22 DIAGNOSIS — R339 Retention of urine, unspecified: Secondary | ICD-10-CM

## 2022-03-25 LAB — CULTURE, URINE COMPREHENSIVE

## 2022-05-15 ENCOUNTER — Ambulatory Visit: Payer: Medicare HMO | Admitting: Urology

## 2022-05-17 ENCOUNTER — Encounter: Payer: Self-pay | Admitting: Urology

## 2022-06-10 DIAGNOSIS — R7401 Elevation of levels of liver transaminase levels: Secondary | ICD-10-CM | POA: Insufficient documentation

## 2022-06-10 DIAGNOSIS — R7989 Other specified abnormal findings of blood chemistry: Secondary | ICD-10-CM | POA: Insufficient documentation

## 2022-06-14 DIAGNOSIS — G4710 Hypersomnia, unspecified: Secondary | ICD-10-CM | POA: Insufficient documentation

## 2022-06-14 DIAGNOSIS — F1020 Alcohol dependence, uncomplicated: Secondary | ICD-10-CM | POA: Insufficient documentation

## 2022-07-24 ENCOUNTER — Ambulatory Visit: Payer: Medicare HMO | Admitting: Urology

## 2022-07-24 ENCOUNTER — Encounter: Payer: Self-pay | Admitting: Urology

## 2022-07-24 VITALS — BP 168/78 | HR 66 | Ht 71.38 in | Wt 177.0 lb

## 2022-07-24 DIAGNOSIS — Z87898 Personal history of other specified conditions: Secondary | ICD-10-CM

## 2022-07-24 DIAGNOSIS — N401 Enlarged prostate with lower urinary tract symptoms: Secondary | ICD-10-CM | POA: Diagnosis not present

## 2022-07-24 DIAGNOSIS — R339 Retention of urine, unspecified: Secondary | ICD-10-CM | POA: Diagnosis not present

## 2022-07-24 DIAGNOSIS — C61 Malignant neoplasm of prostate: Secondary | ICD-10-CM | POA: Diagnosis not present

## 2022-07-24 DIAGNOSIS — Z87440 Personal history of urinary (tract) infections: Secondary | ICD-10-CM

## 2022-07-24 LAB — BLADDER SCAN AMB NON-IMAGING

## 2022-07-24 NOTE — Progress Notes (Signed)
   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  PSA has been stable in the 5-6 range.  He was seen in clinic on 03/05/2022 with difficulty urinating after having a hip replaced and bladder scan was elevated greater than 1 L and a Foley was placed.  He then failed a voiding trial 1 week later with persistently elevated PVRs of 700 mL, and was started on intermittent catheterization.  Only a few weeks after starting intermittent catheterization he resumed spontaneous voiding with low postvoid volumes less than 200 mL.  He was also treated for a UTI that he developed after starting catheterization.  He opted to start Flomax at that time.  He has been doing very well since then and denies any significant urinary complaints today, PVR is normal at 3 mL.  He has not had to catheterize at all.  He wonders if he can stop the Flomax.  We discussed the risk of worsening urinary symptoms if he stopped the Flomax, and he will think about this first.  Regarding his low risk prostate cancer, would be due for a repeat PSA in 6 months, we discussed the likelihood of an abnormally falsely elevated PSA with his recent catheterizations and UTI.  Okay to trial off Flomax, but risk discussed RTC 6 months PSA prior, PVR    Billey Co, MD  [facility] [address] [place], [place] [address] [phone]

## 2022-09-30 DIAGNOSIS — G4733 Obstructive sleep apnea (adult) (pediatric): Secondary | ICD-10-CM | POA: Insufficient documentation

## 2022-12-20 ENCOUNTER — Other Ambulatory Visit: Payer: Medicare HMO

## 2022-12-26 ENCOUNTER — Encounter: Payer: Self-pay | Admitting: Urology

## 2022-12-26 ENCOUNTER — Ambulatory Visit: Payer: Medicare HMO | Admitting: Urology

## 2022-12-26 VITALS — BP 127/70 | HR 84 | Ht 72.0 in | Wt 185.0 lb

## 2022-12-26 DIAGNOSIS — R339 Retention of urine, unspecified: Secondary | ICD-10-CM

## 2022-12-26 DIAGNOSIS — C61 Malignant neoplasm of prostate: Secondary | ICD-10-CM

## 2022-12-26 DIAGNOSIS — Z87898 Personal history of other specified conditions: Secondary | ICD-10-CM

## 2022-12-26 LAB — BLADDER SCAN AMB NON-IMAGING

## 2022-12-26 NOTE — Progress Notes (Signed)
   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  PSA has been stable in the 5-6 range.  He had an MRI prior to biopsy, and biopsy was a fusion biopsy, did not have a confirmatory biopsy.  Most recent PSA 6.19 from March 2023.  He was seen in clinic on 03/05/2022 with difficulty urinating after having a hip replaced and bladder scan was elevated greater than 1 L and a Foley was placed.  He then failed a voiding trial 1 week later with persistently elevated PVRs of 700 mL, and was started on intermittent catheterization.  Only a few weeks after starting intermittent catheterization he resumed spontaneous voiding with low postvoid volumes less than 200 mL.  He was also treated for a UTI that he developed after starting catheterization.  He opted to start Flomax at that time.  At follow-up in November 2023 he was urinating well with a normal PVR of 30ml, and no longer was having to catheterize.  He was interested in stopping the Flomax at that time, but sounds like he never discontinued that medication.  He actually had a few episodes of dizziness and syncopal episodes and recently stopped the Flomax.  This seems to have improved his symptoms, and he denies any worsening of his urination.  PVR is normal today at 49ml.  We reviewed the importance of continuing PSA monitoring for his low risk prostate cancer.  PSA today, call with results     Sondra Come, MD  [facility] [address], [address] [place], [place] [address] [phone]

## 2022-12-27 ENCOUNTER — Telehealth: Payer: Self-pay

## 2022-12-27 DIAGNOSIS — R972 Elevated prostate specific antigen [PSA]: Secondary | ICD-10-CM

## 2022-12-27 LAB — PSA: Prostate Specific Ag, Serum: 10.2 ng/mL — ABNORMAL HIGH (ref 0.0–4.0)

## 2022-12-27 NOTE — Telephone Encounter (Signed)
Called pt informed him of the information below. Pt voiced understanding. PSA ordered. Appts scheduled.

## 2022-12-27 NOTE — Telephone Encounter (Signed)
-----   Message from Sondra Come, MD sent at 12/27/2022 12:41 PM EDT ----- PSA is up to 10.2.  Would recommend to repeat PSA with reflex to free in 2 months.  If he can obtain his outside prostate MRI disc or report that would also be helpful for the next visit, if not that is okay  Legrand Rams, MD 12/27/2022

## 2023-02-19 DIAGNOSIS — F411 Generalized anxiety disorder: Secondary | ICD-10-CM | POA: Insufficient documentation

## 2023-02-21 ENCOUNTER — Other Ambulatory Visit: Payer: Medicare HMO

## 2023-02-21 DIAGNOSIS — R972 Elevated prostate specific antigen [PSA]: Secondary | ICD-10-CM

## 2023-02-22 LAB — PSA: Prostate Specific Ag, Serum: 12 ng/mL — ABNORMAL HIGH (ref 0.0–4.0)

## 2023-03-05 ENCOUNTER — Ambulatory Visit: Payer: Medicare HMO | Admitting: Urology

## 2023-03-05 ENCOUNTER — Encounter: Payer: Self-pay | Admitting: Urology

## 2023-04-10 DIAGNOSIS — I951 Orthostatic hypotension: Secondary | ICD-10-CM | POA: Insufficient documentation

## 2023-05-15 ENCOUNTER — Other Ambulatory Visit: Payer: Self-pay

## 2023-05-15 ENCOUNTER — Other Ambulatory Visit: Payer: Medicare HMO

## 2023-05-15 DIAGNOSIS — R972 Elevated prostate specific antigen [PSA]: Secondary | ICD-10-CM

## 2023-05-16 LAB — PSA: Prostate Specific Ag, Serum: 10.8 ng/mL — ABNORMAL HIGH (ref 0.0–4.0)

## 2023-05-22 ENCOUNTER — Encounter: Payer: Self-pay | Admitting: Urology

## 2023-05-22 ENCOUNTER — Ambulatory Visit: Payer: Medicare HMO | Admitting: Urology

## 2023-05-22 VITALS — BP 186/81 | HR 65 | Ht 72.0 in | Wt 180.0 lb

## 2023-05-22 DIAGNOSIS — C61 Malignant neoplasm of prostate: Secondary | ICD-10-CM

## 2023-05-22 DIAGNOSIS — Z8546 Personal history of malignant neoplasm of prostate: Secondary | ICD-10-CM

## 2023-05-22 DIAGNOSIS — R972 Elevated prostate specific antigen [PSA]: Secondary | ICD-10-CM

## 2023-05-22 DIAGNOSIS — R399 Unspecified symptoms and signs involving the genitourinary system: Secondary | ICD-10-CM | POA: Diagnosis not present

## 2023-05-22 MED ORDER — SILDENAFIL CITRATE 50 MG PO TABS: 50.0000 mg | ORAL_TABLET | ORAL | 6 refills | Status: DC | PRN

## 2023-05-22 NOTE — Progress Notes (Signed)
   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  PSA had been stable in the 5-6 range.  He had an MRI prior to biopsy, and biopsy was a fusion biopsy, did not have a confirmatory biopsy.  We have been unable to get the outside records or MRI, as his urologist retired and the hospital reportedly was brought out.   He was seen in clinic on 03/05/2022 with difficulty urinating after having a hip replaced and bladder scan was elevated greater than 1 L and a Foley was placed.  He then failed a voiding trial 1 week later with persistently elevated PVRs of 700 mL, and was started on intermittent catheterization.  Only a few weeks after starting intermittent catheterization he resumed spontaneous voiding with low postvoid volumes less than 200 mL.  He was also treated for a UTI that he developed after starting catheterization.  He opted to start Flomax at that time.  At follow-up in November 2023 he was urinating well with a normal PVR of 3ml, and no longer was having to catheterize.  He ultimately stopped the Flomax after having side effects of dizziness and syncopal episodes.  At follow-up in April 2024 PVR was normal off the Flomax at 60 mL, and he denied any urinary symptoms.  PSA has been steadily elevated over the last 6 months including 10.2 in April 2024, 12 in June 2024, and most recently 10.8 in August 2024.  With his history of low risk prostate cancer, we reviewed the guidelines that recommend a confirmatory biopsy within the first 2 years.  He is interested in proceeding with an MRI first, and potentially an MRI fusion guided biopsy.  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  If unable to obtain MRI,  can schedule transrectal ultrasound-guided prostate biopsy.  Prostate MRI, anticipate scheduling MRI fusion biopsy if any abnormal findings.  If MRI normal will need confirmatory transrectal ultrasound-guided prostate biopsy  Sondra Come, MD  [facility] [address], [address] [place], [place] [address] [phone]

## 2023-06-21 NOTE — Progress Notes (Unsigned)
Psychiatric Initial Adult Assessment   Patient Identification: Alejandro Stuart MRN:  [id number] Date of Evaluation:  06/21/2023 Referral Source: *** Chief Complaint:  No chief complaint on file.  Visit Diagnosis: No diagnosis found.  History of Present Illness:   Alejandro Stuart is a 77 y.o. year old male with a history of anxiety, alcohol use, hypertension, collagenous colitis, sinus bradycardia,  , who presents for follow up appointment for below.     Aolpidem 5 mg,  Mirtazapine 7.5 mg,  Lexapro 10 mg daily  Anltrexone 50 mg daily   No utox  Associated Signs/Symptoms: Depression Symptoms:  {DEPRESSION SYMPTOMS:20000} (Hypo) Manic Symptoms:  {BHH MANIC SYMPTOMS:22872} Anxiety Symptoms:  {BHH ANXIETY SYMPTOMS:22873} Psychotic Symptoms:  {BHH PSYCHOTIC SYMPTOMS:22874} PTSD Symptoms: {BHH PTSD SYMPTOMS:22875}  Past Psychiatric History:  Outpatient:  Psychiatry admission:  Previous suicide attempt:  Past trials of medication:  History of violence:  History of head injury:   Previous Psychotropic Medications: {YES/NO:21197}  Substance Abuse History in the last 12 months:  {yes no:314532}  Consequences of Substance Abuse: {BHH CONSEQUENCES OF SUBSTANCE ABUSE:22880}  Past Medical History:  Past Medical History:  Diagnosis Date   Collagenous colitis    Hypertension    Insomnia    Prostate cancer (HCC)    Sinus bradycardia     Past Surgical History:  Procedure Laterality Date   KNEE ARTHROSCOPY      Family Psychiatric History: ***  Family History: No family history on file.  Social History:   Social History   Socioeconomic History   Marital status: Married    Spouse name: Not on file   Number of children: Not on file   Years of education: Not on file   Highest education level: Not on file  Occupational History   Not on file  Tobacco Use   Smoking status: Former    Current packs/day: 0.00    Types: Cigarettes    Quit date: 09/16/1986    Years since  quitting: 36.7    Passive exposure: Past   Smokeless tobacco: Never  Substance and Sexual Activity   Alcohol use: Yes    Alcohol/week: 2.0 standard drinks of alcohol    Types: 2 Standard drinks or equivalent per week   Drug use: Never   Sexual activity: Yes    Birth control/protection: None  Other Topics Concern   Not on file  Social History Narrative   Not on file   Social Determinants of Health   Financial Resource Strain: Low Risk  (01/08/2023)   [facility], [facility]   Overall Financial Resource Strain (CARDIA)    Difficulty of Paying Living Expenses: Not hard at all  Food Insecurity: Unknown (01/08/2023)   Received from [facility], [facility]   Hunger Vital Sign    Worried About Running Out of Food in the Last Year: Not on file    Ran Out of Food in the Last Year: Never true  Transportation Needs: No Transportation Needs (01/08/2023)   Received from [facility], [facility]   [facility] - Transportation    In the past 12 months, has lack of transportation kept you from medical appointments or from getting medications?: No    Lack of Transportation (Non-Medical): No  Physical Activity: Not on file  Stress: Not on file  Social Connections: Not on file    Additional Social History: ***  Allergies:   Allergies  Allergen Reactions  Statins     Other reaction(s): Liver Disorder Abnl lft's    Metabolic Disorder Labs: No results found for: "HGBA1C", "MPG" No results found for: "PROLACTIN" No results found for: "CHOL", "TRIG", "HDL", "CHOLHDL", "VLDL", "LDLCALC" No results found for: "TSH"  Therapeutic Level Labs: No results found for: "LITHIUM" No results found for: "CBMZ" No results found for: "VALPROATE"  Current Medications: Current Outpatient Medications  Medication Sig Dispense Refill   amitriptyline (ELAVIL) 50 MG tablet Take 50 mg by  mouth at bedtime.     EQ SENNA-S 8.6-50 MG tablet TAKE 2 TABLETS BY MOUTH IN THE MORNING     losartan (COZAAR) 50 MG tablet Take 25 mg by mouth daily.     sildenafil (VIAGRA) 50 MG tablet Take 1-2 tablets (50-100 mg total) by mouth as needed. Take 1-2 tablets on empty stomach 30-45 minutes prior to sexual activity 30 tablet 6   UNABLE TO FIND fluorouracil 5% + calcipotriene 0.005% Apply thin layer twice daily to scalp for 3-5 days as directed     zolpidem (AMBIEN) 10 MG tablet Take 5 mg by mouth at bedtime. Take 1/2 tablet by mouth nightly     No current facility-administered medications for this visit.    Musculoskeletal: Strength & Muscle Tone: within normal limits Gait & Station: normal Patient leans: N/A  Psychiatric Specialty Exam: Review of Systems  There were no vitals taken for this visit.There is no height or weight on file to calculate BMI.  General Appearance: {Appearance:22683}  Eye Contact:  {BHH EYE CONTACT:22684}  Speech:  Clear and Coherent  Volume:  Normal  Mood:  {BHH MOOD:22306}  Affect:  {Affect (PAA):22687}  Thought Process:  Coherent  Orientation:  Full (Time, Place, and Person)  Thought Content:  Logical  Suicidal Thoughts:  {ST/HT (PAA):22692}  Homicidal Thoughts:  {ST/HT (PAA):22692}  Memory:  Immediate;   Good  Judgement:  {Judgement (PAA):22694}  Insight:  {Insight (PAA):22695}  Psychomotor Activity:  Normal  Concentration:  Concentration: Good and Attention Span: Good  Recall:  Good  Fund of Knowledge:Good  Language: Good  Akathisia:  No  Handed:  Right  AIMS (if indicated):  not done  Assets:  Communication Skills Desire for Improvement  ADL's:  Intact  Cognition: WNL  Sleep:  {BHH GOOD/FAIR/POOR:22877}   Screenings:   Assessment and Plan:  Assessment  Plan   The patient demonstrates the following risk factors for suicide: Chronic risk factors for suicide include: {Chronic Risk Factors for WUJWJXB:14782956}. Acute risk factors for  suicide include: {Acute Risk Factors for OZHYQMV:78469629}. Protective factors for this patient include: {Protective Factors for Suicide BMWU:13244010}. Considering these factors, the overall suicide risk at this point appears to be {Desc; low/moderate/high:110033}. Patient {ACTION; IS/IS UVO:53664403} appropriate for outpatient follow up.   Collaboration of Care: {BH OP Collaboration of Care:21014065}  Patient/Guardian was advised Release of Information must be obtained prior to any record release in order to collaborate their care with an outside provider. Patient/Guardian was advised if they have not already done so to contact the registration department to sign all necessary forms in order for [place] to release information regarding their care.   Consent: Patient/Guardian gives verbal consent for treatment and assignment of benefits for services provided during this visit. Patient/Guardian expressed understanding and agreed to proceed.   Neysa Hotter, MD 10/5/202411:21 AM

## 2023-06-26 ENCOUNTER — Ambulatory Visit: Payer: Medicare HMO | Admitting: Psychiatry

## 2023-06-26 ENCOUNTER — Encounter: Payer: Self-pay | Admitting: Psychiatry

## 2023-06-26 VITALS — BP 142/88 | HR 60 | Temp 98.7°F | Ht 72.0 in | Wt 195.8 lb

## 2023-06-26 DIAGNOSIS — G4700 Insomnia, unspecified: Secondary | ICD-10-CM | POA: Diagnosis not present

## 2023-06-26 DIAGNOSIS — F1020 Alcohol dependence, uncomplicated: Secondary | ICD-10-CM | POA: Diagnosis not present

## 2023-06-26 DIAGNOSIS — F1994 Other psychoactive substance use, unspecified with psychoactive substance-induced mood disorder: Secondary | ICD-10-CM | POA: Diagnosis not present

## 2023-06-26 NOTE — Patient Instructions (Signed)
Continue mirtazapine 7.5 mg at night  Obtain lab (LFT) Start naltrexone 25 mg at night after obtaining lab Referral to [facility] (chemical dependency group) Next appointment: 12/3 at 10:30

## 2023-06-30 ENCOUNTER — Telehealth (HOSPITAL_COMMUNITY): Payer: Self-pay

## 2023-06-30 NOTE — Telephone Encounter (Signed)
Alejandro Stuart says he cannot talk further but will take therapist number and call back if he is interested.  Remigio Eisenmenger, MS., LMFT, LCAS 06-30-23

## 2023-07-10 ENCOUNTER — Ambulatory Visit
Admission: RE | Admit: 2023-07-10 | Discharge: 2023-07-10 | Disposition: A | Discharge status: Home / Self Care | Payer: Medicare HMO | Source: Ambulatory Visit | Attending: Urology | Admitting: Urology

## 2023-07-10 DIAGNOSIS — C61 Malignant neoplasm of prostate: Secondary | ICD-10-CM | POA: Insufficient documentation

## 2023-07-10 MED ORDER — GADOBUTROL 1 MMOL/ML IV SOLN
9.0000 mL | Freq: Once | INTRAVENOUS | Status: AC | PRN
  Administered 2023-07-10: 9 mL via INTRAVENOUS

## 2023-07-16 ENCOUNTER — Telehealth: Payer: Self-pay

## 2023-07-16 NOTE — Telephone Encounter (Signed)
Called pt informed him of the information below. Pt voiced understanding. Prostate bx instructions reviewed and sent via mychart. Appt scheduled.

## 2023-07-16 NOTE — Telephone Encounter (Signed)
-----   Message from Sondra Come sent at 07/15/2023  8:35 AM EDT ----- Patient with low risk PCa on active surveillance. MRI shows 2 small abnormal areas, recommend MRI fusion bx as discussed in clinic/. Please review instructions and schedule,t hanks  Legrand Rams, MD 07/15/2023

## 2023-07-28 NOTE — Telephone Encounter (Signed)
Patient's last prostate biopsy records from November 2022 (Dr Welton Flakes) faxed to Dr Legrand Rams at [facility] in [place].      Fax # [phone]

## 2023-08-08 ENCOUNTER — Ambulatory Visit: Payer: Medicare HMO | Admitting: Urology

## 2023-08-08 VITALS — BP 161/72 | HR 73

## 2023-08-08 DIAGNOSIS — C61 Malignant neoplasm of prostate: Secondary | ICD-10-CM | POA: Diagnosis not present

## 2023-08-08 DIAGNOSIS — N4232 Atypical small acinar proliferation of prostate: Secondary | ICD-10-CM

## 2023-08-08 DIAGNOSIS — Z2989 Encounter for other specified prophylactic measures: Secondary | ICD-10-CM

## 2023-08-08 DIAGNOSIS — Z792 Long term (current) use of antibiotics: Secondary | ICD-10-CM

## 2023-08-08 DIAGNOSIS — R972 Elevated prostate specific antigen [PSA]: Secondary | ICD-10-CM | POA: Diagnosis not present

## 2023-08-08 MED ORDER — GENTAMICIN SULFATE 40 MG/ML IJ SOLN
80.0000 mg | Freq: Once | INTRAMUSCULAR | Status: AC
Start: 2023-08-08 — End: 2023-08-08
  Administered 2023-08-08: 80 mg via INTRAMUSCULAR

## 2023-08-08 MED ORDER — LEVOFLOXACIN 500 MG PO TABS
500.0000 mg | ORAL_TABLET | Freq: Once | ORAL | Status: AC
Start: 2023-08-08 — End: 2023-08-08
  Administered 2023-08-08: 500 mg via ORAL

## 2023-08-08 NOTE — Progress Notes (Signed)
   08/08/23  Indication: Personal history of low risk prostate cancer, PSA 10.2 when abnormal prostate MRI  MRI Fusion Prostate Biopsy Procedure   Informed consent was obtained, and we discussed the risks of bleeding and infection/sepsis. A time out was performed to ensure correct patient identity.  Pre-Procedure: - Gentamicin and levaquin given for antibiotic prophylaxis -Prostate measured 48.6 g on MRI, PSA density 0.21  Procedure: - Prostate block performed using 10 cc 1% lidocaine  - MRI fusion biopsy was performed, and 3 biopsies were taken from the ROI #1 PI-RADS 4 located in the right posterior lateral peripheral zone at the apex as well as 3 biopsies from the ROI #2 a PI-RADS 4 at the left posterior lateral peripheral zone - Standard biopsies taken from sextant areas, 12 under ultrasound guidance. - Total of 18 cores taken  Post-Procedure: - Patient tolerated the procedure well - He was counseled to seek immediate medical attention if experiences significant bleeding, fevers, or severe pain - Return in one week to discuss biopsy results  Assessment/ Plan: Will follow up in 1-2 weeks to discuss pathology with Dr. Zadie Rhine, MD

## 2023-08-08 NOTE — Patient Instructions (Signed)

## 2023-08-15 NOTE — Progress Notes (Unsigned)
BH MD/PA/NP OP Progress Note  08/15/2023 12:09 PM Alejandro Stuart  MRN:  [id number]  Chief Complaint: No chief complaint on file.  HPI: ***   Substance use   Tobacco Alcohol Other substances/  Current   6-8 oz of vodka every morning for eye opening, and "upset" stomach denies  Past   Since college, on and off Marijuana one time  Past Treatment   No DTs, withdrawal,  longest sobriety was 9 months in 2022 (was in AA group)        Household: daughter, son in Social worker (moved from [place]) Marital status: married Number of children: 7 daughters ([place], [place], [place], [place] 4 from previous marriage Employment: owns company Education:  bachelor in Insurance account manager and accounting  Visit Diagnosis: No diagnosis found.  Past Psychiatric History: Please see initial evaluation for full details. I have reviewed the history. No updates at this time.     Past Medical History:  Past Medical History:  Diagnosis Date   Collagenous colitis    Hypertension    Insomnia    Prostate cancer (HCC)    Sinus bradycardia     Past Surgical History:  Procedure Laterality Date   KNEE ARTHROSCOPY      Family Psychiatric History: Please see initial evaluation for full details. I have reviewed the history. No updates at this time.     Family History:  Family History  Problem Relation Age of Onset   Stroke Father    Prostate cancer Father     Social History:  Social History   Socioeconomic History   Marital status: Married    Spouse name: Not on file   Number of children: 7   Years of education: Not on file   Highest education level: Bachelor's degree (e.g., BA, AB, BS)  Occupational History   Not on file  Tobacco Use   Smoking status: Former    Current packs/day: 0.00    Types: Cigarettes    Quit date: 09/16/1986    Years since quitting: 36.9    Passive exposure: Past   Smokeless tobacco: Never  Vaping Use   Vaping status: Never Used  Substance and Sexual Activity   Alcohol use: Yes     Alcohol/week: 7.0 standard drinks of alcohol    Types: 7 Shots of liquor per week   Drug use: Never   Sexual activity: Yes    Birth control/protection: None  Other Topics Concern   Not on file  Social History Narrative   Not on file   Social Determinants of Health   Financial Resource Strain: Low Risk  (01/08/2023)   Received from [facility], [facility]   Overall Financial Resource Strain (CARDIA)    Difficulty of Paying Living Expenses: Not hard at all  Food Insecurity: Unknown (01/08/2023)   Received from [facility], [facility]   Hunger Vital Sign    Worried About Running Out of Food in the Last Year: Not on file    Ran Out of Food in the Last Year: Never true  Transportation Needs: No Transportation Needs (01/08/2023)   Received from [facility], [facility]   [facility] - Transportation    In the past 12 months, has lack of transportation kept you from medical appointments or from getting medications?: No    Lack of Transportation (Non-Medical): No  Physical Activity: Not on file  Stress: Not on file  Social Connections: Not on file  Allergies:  Allergies  Allergen Reactions   Statins     Other reaction(s): Liver Disorder Abnl lft's    Metabolic Disorder Labs: No results found for: "HGBA1C", "MPG" No results found for: "PROLACTIN" No results found for: "CHOL", "TRIG", "HDL", "CHOLHDL", "VLDL", "LDLCALC" No results found for: "TSH"  Therapeutic Level Labs: No results found for: "LITHIUM" No results found for: "VALPROATE" No results found for: "CBMZ"  Current Medications: Current Outpatient Medications  Medication Sig Dispense Refill   EQ SENNA-S 8.6-50 MG tablet TAKE 2 TABLETS BY MOUTH IN THE MORNING     losartan (COZAAR) 50 MG tablet Take 25 mg by mouth daily.     mirtazapine (REMERON) 7.5 MG tablet Take 7.5 mg by mouth at bedtime.     sildenafil  (VIAGRA) 50 MG tablet Take 1-2 tablets (50-100 mg total) by mouth as needed. Take 1-2 tablets on empty stomach 30-45 minutes prior to sexual activity 30 tablet 6   UNABLE TO FIND fluorouracil 5% + calcipotriene 0.005% Apply thin layer twice daily to scalp for 3-5 days as directed     zolpidem (AMBIEN) 10 MG tablet Take 5 mg by mouth at bedtime. Take 1/2 tablet by mouth nightly     No current facility-administered medications for this visit.     Musculoskeletal: Strength & Muscle Tone: within normal limits Gait & Station: normal Patient leans: N/A  Psychiatric Specialty Exam: Review of Systems  There were no vitals taken for this visit.There is no height or weight on file to calculate BMI.  General Appearance: {Appearance:22683}  Eye Contact:  {BHH EYE CONTACT:22684}  Speech:  Clear and Coherent  Volume:  Normal  Mood:  {BHH MOOD:22306}  Affect:  {Affect (PAA):22687}  Thought Process:  Coherent  Orientation:  Full (Time, Place, and Person)  Thought Content: Logical   Suicidal Thoughts:  {ST/HT (PAA):22692}  Homicidal Thoughts:  {ST/HT (PAA):22692}  Memory:  Immediate;   Good  Judgement:  {Judgement (PAA):22694}  Insight:  {Insight (PAA):22695}  Psychomotor Activity:  Normal  Concentration:  Concentration: Good and Attention Span: Good  Recall:  Good  Fund of Knowledge: Good  Language: Good  Akathisia:  No  Handed:  Right  AIMS (if indicated): not done  Assets:  Communication Skills Desire for Improvement  ADL's:  Intact  Cognition: WNL  Sleep:  {BHH GOOD/FAIR/POOR:22877}   Screenings: GAD-7    Flowsheet Row Office Visit from 06/26/2023 in [facility]  Total GAD-7 Score 13      PHQ2-9    Flowsheet Row Office Visit from 06/26/2023 in [facility]  PHQ-2 Total Score 5  PHQ-9 Total Score 12        Assessment and Plan:  Alejandro Stuart is a 77 y.o. year old male with a history  of anxiety, alcohol use, hypertension, collagenous colitis, sinus bradycardia,  who is referred for anxiety, alcohol use.    1. Alcohol use disorder, moderate, dependence (HCC) 2. Substance induced mood disorder (HCC) # r/o MDD # r/o alcoholic hallucination After having discussion at length, he is motivated for sobriety. Will plan to start naltrexone for alcohol abstinence after obtaining lab. Discussed risk of nausea, LFT abnormality.  He reportedly reports no withdrawal/DTs in the past when he quit drinking. He was advised the possible risk of DT. He was advised to contact the office if he experiences any symptoms (and consider reducing the amount by 25% every week). Will continue current dose of mirtazapine at this time to  target his mood symptoms, although he may benefit from uptitration in the future.   3. Insomnia, unspecified type He is on Ambien, prescribed by his PCP. He was advised to consider refraining the use due to risk of dependence, and sedation with concomitant alcohol use.      Plan Continue mirtazapine 7.5 mg at night  Obtain lab (LFT) Start naltrexone 25 mg at night after obtaining lab Referral to CDIOP (chemical dependency group) Next appointment: 12/3 at 10:30 for 30 min, IP - on Ambien 10 mg at night, prescribed by his PCP   The patient demonstrates the following risk factors for suicide: Chronic risk factors for suicide include: psychiatric disorder of depression, anxiety and substance use disorder. Acute risk factors for suicide include: loss (financial, interpersonal, professional). Protective factors for this patient include: responsibility to others (children, family), coping skills, and hope for the future. Considering these factors, the overall suicide risk at this point appears to be low. Patient is appropriate for outpatient follow up.   Collaboration of Care: Collaboration of Care: {BH OP Collaboration of Care:21014065}  Patient/Guardian was advised Release of  Information must be obtained prior to any record release in order to collaborate their care with an outside provider. Patient/Guardian was advised if they have not already done so to contact the registration department to sign all necessary forms in order for [place] to release information regarding their care.   Consent: Patient/Guardian gives verbal consent for treatment and assignment of benefits for services provided during this visit. Patient/Guardian expressed understanding and agreed to proceed.    Neysa Hotter, MD 08/15/2023, 12:09 PM

## 2023-08-19 ENCOUNTER — Ambulatory Visit: Payer: Medicare HMO | Admitting: Psychiatry

## 2023-08-19 ENCOUNTER — Ambulatory Visit: Payer: Medicare HMO | Admitting: Urology

## 2023-08-19 ENCOUNTER — Encounter: Payer: Self-pay | Admitting: Urology

## 2023-08-19 VITALS — BP 151/65 | HR 60 | Ht 72.0 in | Wt 180.0 lb

## 2023-08-19 DIAGNOSIS — N529 Male erectile dysfunction, unspecified: Secondary | ICD-10-CM

## 2023-08-19 DIAGNOSIS — C61 Malignant neoplasm of prostate: Secondary | ICD-10-CM

## 2023-08-19 MED ORDER — SILDENAFIL CITRATE 50 MG PO TABS: 50.0000 mg | ORAL_TABLET | ORAL | 6 refills | Status: AC | PRN

## 2023-08-19 NOTE — Progress Notes (Signed)
08/19/2023 3:13 PM   Alejandro Stuart 1945-11-26 [id number]  Reason for visit: Follow up prostate cancer, urinary symptoms, ED  HPI: Healthy 77 year old male who I originally met in April 2023 to establish care for low risk prostate cancer on active surveillance that was originally diagnosed in [place].  Initial PSA was stable in the 5-6 range.  PSA had increased significantly over the last 6 months including 10.2 in April 2024, 12 in June 2024, and 10.8 in August 2024.  This prompted a repeat prostate MRI in October 2024 showing a 49 g prostate and PI-RADS 4 lesion in the right posterior lateral peripheral zone and left posterior lateral peripheral zone.  He underwent an MRI fusion biopsy with Dr. Apolinar Junes on 08/13/2023 showing 1 core of low risk disease on the random biopsies, however Gleason score 4+3=7 unfavorable intermediate risk disease with 30% core involvement in ROI #1.  He also has a history of urinary retention in June 2023 after hip replacement, had to catheterize intermittently temporarily, but symptoms ultimately resolved, he ultimately discontinued Flomax secondary to bothersome side effects of dizziness and a syncopal episode, and PVRs have been normal since that time.  We had a lengthy conversation today about the patient's new diagnosis of prostate cancer.  We reviewed the risk classifications per the AUA guidelines including very low risk, low risk, intermediate risk, and high risk disease, and the need for additional staging imaging with CT and bone scan in patients with unfavorable intermediate risk and high risk disease.  I explained that his life expectancy, clinical stage, Gleason score, PSA, and other co-morbidities influence treatment strategies.  We discussed the roles of active surveillance, radiation therapy, surgical therapy with robotic prostatectomy, and hormone therapy with androgen deprivation.  We discussed that patients urinary symptoms also impact treatment strategy,  as patients with severe lower urinary tract symptoms may have significant worsening or even develop urinary retention after undergoing radiation.In regards to surgery, we discussed robotic prostatectomy +/- lymphadenectomy at length.  The procedure takes 3 to 4 hours, and patient's typically discharge home on post-op day #1.  A Foley catheter is left in place for 7 to 10 days to allow for healing of the vesicourethral anastomosis.  There is a small risk of bleeding, infection, damage to surrounding structures or bowel, hernia, DVT/PE, or serious cardiac or pulmonary complications.  We discussed at length post-op side effects including erectile dysfunction, and the importance of pre-operative erectile function on long-term outcomes.  Even with a nerve sparing approach, there is an approximately 25% rate of permanent erectile dysfunction.  We also discussed postop urinary incontinence at length.  We expect patients to have stress incontinence post-operatively that will improve over period of weeks to months.  Less than 10% of men will require a pad at 1 year after surgery.  Patients will need to avoid heavy lifting and strenuous activity for 3 to 4 weeks, but most men return to their baseline activity status by 6 weeks.  -In summary, Alejandro Stuart is a 77 y.o. man with upstaging of prior low risk prostate cancer now to unfavorable intermediate risk disease.  With his excellent health I recommended considering more definitive treatment.  He would like to avoid surgery with his age which I think is reasonable.  He would be interested in meeting with radiation oncology, but he also is interested in potentially continuing surveillance.  Risks of surveillance with unfavorable intermediate risk disease were discussed extensively. -Sildenafil refilled    Sondra Come, MD  [facility] [address] [place], [place] [address] [phone]

## 2023-08-27 ENCOUNTER — Ambulatory Visit: Payer: Medicare HMO | Admitting: Dermatology

## 2023-08-27 ENCOUNTER — Encounter: Payer: Self-pay | Admitting: Dermatology

## 2023-08-27 DIAGNOSIS — L814 Other melanin hyperpigmentation: Secondary | ICD-10-CM | POA: Diagnosis not present

## 2023-08-27 DIAGNOSIS — C61 Malignant neoplasm of prostate: Secondary | ICD-10-CM | POA: Insufficient documentation

## 2023-08-27 DIAGNOSIS — L578 Other skin changes due to chronic exposure to nonionizing radiation: Secondary | ICD-10-CM

## 2023-08-27 DIAGNOSIS — G629 Polyneuropathy, unspecified: Secondary | ICD-10-CM | POA: Insufficient documentation

## 2023-08-27 DIAGNOSIS — N529 Male erectile dysfunction, unspecified: Secondary | ICD-10-CM | POA: Insufficient documentation

## 2023-08-27 DIAGNOSIS — W908XXA Exposure to other nonionizing radiation, initial encounter: Secondary | ICD-10-CM

## 2023-08-27 DIAGNOSIS — G8929 Other chronic pain: Secondary | ICD-10-CM | POA: Insufficient documentation

## 2023-08-27 DIAGNOSIS — I10 Essential (primary) hypertension: Secondary | ICD-10-CM | POA: Insufficient documentation

## 2023-08-27 DIAGNOSIS — R279 Unspecified lack of coordination: Secondary | ICD-10-CM | POA: Insufficient documentation

## 2023-08-27 DIAGNOSIS — L570 Actinic keratosis: Secondary | ICD-10-CM

## 2023-08-27 DIAGNOSIS — L821 Other seborrheic keratosis: Secondary | ICD-10-CM

## 2023-08-27 DIAGNOSIS — D1801 Hemangioma of skin and subcutaneous tissue: Secondary | ICD-10-CM

## 2023-08-27 DIAGNOSIS — Z9889 Other specified postprocedural states: Secondary | ICD-10-CM | POA: Insufficient documentation

## 2023-08-27 DIAGNOSIS — L209 Atopic dermatitis, unspecified: Secondary | ICD-10-CM

## 2023-08-27 DIAGNOSIS — Z1283 Encounter for screening for malignant neoplasm of skin: Secondary | ICD-10-CM | POA: Diagnosis not present

## 2023-08-27 DIAGNOSIS — D229 Melanocytic nevi, unspecified: Secondary | ICD-10-CM

## 2023-08-27 DIAGNOSIS — D492 Neoplasm of unspecified behavior of bone, soft tissue, and skin: Secondary | ICD-10-CM

## 2023-08-27 DIAGNOSIS — D485 Neoplasm of uncertain behavior of skin: Secondary | ICD-10-CM

## 2023-08-27 DIAGNOSIS — M1712 Unilateral primary osteoarthritis, left knee: Secondary | ICD-10-CM | POA: Insufficient documentation

## 2023-08-27 MED ORDER — TRIAMCINOLONE ACETONIDE 0.1 % EX CREA
1.0000 | TOPICAL_CREAM | Freq: Two times a day (BID) | CUTANEOUS | 2 refills | Status: DC | PRN
Start: 2023-08-27 — End: 2024-08-09

## 2023-08-27 NOTE — Progress Notes (Signed)
New Patient Visit   Subjective  Alejandro Stuart is a 77 y.o. male who presents for the following: Rash at left and right thigh, itchy, present for about a month. Itching at shoulders for at least a year. Using [facility].   New patient referred for a rash. Patient advises that he has had many skin cancers and that rash is not his main concern but his wife thinks it could be ringworm. History of AK's treated in the past with PDT and topicals.    The following portions of the chart were reviewed this encounter and updated as appropriate: medications, allergies, medical history  Review of Systems:  No other skin or systemic complaints except as noted in HPI or Assessment and Plan.  Objective  Well appearing patient in no apparent distress; mood and affect are within normal limits.  A focused examination was performed of the following areas: Scalp, face, arms, legs, back  Relevant exam findings are noted in the Assessment and Plan.    Assessment & Plan   Actinic keratoses  Exam: keratotic pink papules on scalp face forearms hands  Actinic keratoses are precancerous spots that appear secondary to cumulative UV radiation exposure/sun exposure over time. They are chronic with expected duration over 1 year. A portion of actinic keratoses will progress to squamous cell carcinoma of the skin. It is not possible to reliably predict which spots will progress to skin cancer and so treatment is recommended to prevent development of skin cancer.  Recommend daily broad spectrum sunscreen SPF 30+ to sun-exposed areas, reapply every 2 hours as needed.  Recommend staying in the shade or wearing long sleeves, sun glasses (UVA+UVB protection) and wide brim hats (4-inch brim around the entire circumference of the hat). Call for new or changing lesions.  Plan: Patient will return for cryotherapy  Neoplasm of uncertain behaviour  Exam: Keratotic plaque on left parietal scalp concerning for  SCC  Plan: patient will return for biopsy   LENTIGINES, SEBORRHEIC KERATOSES, HEMANGIOMAS - Benign normal skin lesions - Benign-appearing - Call for any changes  MELANOCYTIC NEVI - Tan-brown and/or pink-flesh-colored symmetric macules and papules - Benign appearing on exam today - Observation - Call clinic for new or changing moles - Recommend daily use of broad spectrum spf 30+ sunscreen to sun-exposed areas.   ACTINIC DAMAGE - Chronic condition, secondary to cumulative UV/sun exposure - diffuse scaly erythematous macules with underlying dyspigmentation - Recommend daily broad spectrum sunscreen SPF 30+ to sun-exposed areas, reapply every 2 hours as needed.  - Staying in the shade or wearing long sleeves, sun glasses (UVA+UVB protection) and wide brim hats (4-inch brim around the entire circumference of the hat) are also recommended for sun protection.  - Call for new or changing lesions.  SKIN CANCER SCREENING PERFORMED TODAY  Atopic dermatitis, chronic, flaring, not at patient goal  Exam: erythematous scaly papules and excoriations on upper back, posterior shoulders, upper arms, abdomen with diffuse background xerosis  Differential diagnosis:  Eczema   Atopic dermatitis (eczema) is a chronic, relapsing, pruritic condition that can significantly affect quality of life. It is often associated with allergic rhinitis and/or asthma and can require treatment with topical medications, phototherapy, or in severe cases biologic injectable medication (Dupixent; Adbry) or Oral JAK inhibitors.  Treatment Plan: Start TMC 0.1% cream twice daily until smooth and itching stops. Avoid applying to face, groin, and axilla. Use as directed. Long-term use can cause thinning of the skin.  Discussed Dupixent if not improving with  topicals. Patient prefers to avoid    Return for Biopsy and AK's , with Dr. Katrinka Blazing.  Anise Salvo, RMA, am acting as scribe for Elie Goody, MD  .   Documentation: I have reviewed the above documentation for accuracy and completeness, and I agree with the above.  Elie Goody, MD

## 2023-08-27 NOTE — Patient Instructions (Signed)
Treatment Plan: Start TMC 0.1% cream twice daily until smooth and itching stops. Avoid applying to face, groin, and axilla. Use as directed. Long-term use can cause thinning of the skin.  Topical steroids (such as triamcinolone, fluocinolone, fluocinonide, mometasone, clobetasol, halobetasol, betamethasone, hydrocortisone) can cause thinning and lightening of the skin if they are used for too long in the same area. Your physician has selected the right strength medicine for your problem and area affected on the body. Please use your medication only as directed by your physician to prevent side effects.   Due to recent changes in healthcare laws, you may see results of your pathology and/or laboratory studies on MyChart before the doctors have had a chance to review them. We understand that in some cases there may be results that are confusing or concerning to you. Please understand that not all results are received at the same time and often the doctors may need to interpret multiple results in order to provide you with the best plan of care or course of treatment. Therefore, we ask that you please give [place] 2 business days to thoroughly review all your results before contacting the office for clarification. Should we see a critical lab result, you will be contacted sooner.   If You Need Anything After Your Visit  If you have any questions or concerns for your doctor, please call our main line at [phone] and press option 4 to reach your doctor's medical assistant. If no one answers, please leave a voicemail as directed and we will return your call as soon as possible. Messages left after 4 pm will be answered the following business day.   You may also send [place] a message via MyChart. We typically respond to MyChart messages within 1-2 business days.  For prescription refills, please ask your pharmacy to contact our office. Our fax number is [phone].  If you have an urgent issue when the clinic  is closed that cannot wait until the next business day, you can page your doctor at the number below.    Please note that while we do our best to be available for urgent issues outside of office hours, we are not available 24/7.   If you have an urgent issue and are unable to reach [place], you may choose to seek medical care at your doctor's office, retail clinic, urgent care center, or emergency room.  If you have a medical emergency, please immediately call 911 or go to the emergency department.  Pager Numbers  - Dr. Gwen Pounds: [phone]  - Dr. Roseanne Reno: [phone]  - Dr. Katrinka Blazing: [phone]   In the event of inclement weather, please call our main line at [phone] for an update on the status of any delays or closures.  Dermatology Medication Tips: Please keep the boxes that topical medications come in in order to help keep track of the instructions about where and how to use these. Pharmacies typically print the medication instructions only on the boxes and not directly on the medication tubes.   If your medication is too expensive, please contact our office at [phone] option 4 or send [place] a message through MyChart.   We are unable to tell what your co-pay for medications will be in advance as this is different depending on your insurance coverage. However, we may be able to find a substitute medication at lower cost or fill out paperwork to get insurance to cover a needed medication.   If a prior authorization is required to get  your medication covered by your insurance company, please allow [place] 1-2 business days to complete this process.  Drug prices often vary depending on where the prescription is filled and some pharmacies may offer cheaper prices.  The website www.goodrx.com contains coupons for medications through different pharmacies. The prices here do not account for what the cost may be with help from insurance (it may be cheaper with your insurance), but the website  can give you the price if you did not use any insurance.  - You can print the associated coupon and take it with your prescription to the pharmacy.  - You may also stop by our office during regular business hours and pick up a GoodRx coupon card.  - If you need your prescription sent electronically to a different pharmacy, notify our office through [facility] or by phone at [phone] option 4.     Si Usted Necesita Algo Despus de Su Visita  Tambin puede enviarnos un mensaje a travs de Clinical cytogeneticist. Por lo general respondemos a los mensajes de MyChart en el transcurso de 1 a 2 das hbiles.  Para renovar recetas, por favor pida a su farmacia que se ponga en contacto con nuestra oficina. Annie Sable de fax es [place] [phone].  Si tiene un asunto urgente cuando la clnica est cerrada y que no puede esperar hasta el siguiente da hbil, puede llamar/localizar a su doctor(a) al nmero que aparece a continuacin.   Por favor, tenga en cuenta que aunque hacemos todo lo posible para estar disponibles para asuntos urgentes fuera del horario de [place], no estamos disponibles las 24 horas del da, los 7 [address]  [address] de la [place].   Si tiene un problema urgente y no puede comunicarse con nosotros, puede optar por buscar atencin mdica  en el consultorio de su doctor(a), en una clnica privada, en un centro de atencin urgente o en una sala de emergencias.  Si tiene Engineer, drilling, por favor llame inmediatamente al 911 o vaya a la sala de emergencias.  Nmeros de bper  - Dr. Gwen Pounds: [phone]  - Dra. Roseanne Reno: [phone]  - Dr. Katrinka Blazing: [phone]   En caso de inclemencias del tiempo, por favor llame a Lacy Duverney principal al [phone] para una actualizacin sobre el [place] de cualquier retraso o cierre.  Consejos para la medicacin en dermatologa: Por favor, guarde las cajas en las que vienen los medicamentos de uso tpico para ayudarle a seguir las instrucciones  sobre dnde y cmo usarlos. Las farmacias generalmente imprimen las instrucciones del medicamento slo en las cajas y no directamente en los tubos del [place].   Si su medicamento es muy caro, por favor, pngase en contacto con Rolm Gala llamando al [phone] y presione la opcin 4 o envenos un mensaje a travs de Clinical cytogeneticist.   No podemos decirle cul ser su copago por los medicamentos por adelantado ya que esto es diferente dependiendo de la cobertura de su seguro. Sin embargo, es posible que podamos encontrar un medicamento sustituto a Audiological scientist un formulario para que el seguro cubra el medicamento que se considera necesario.   Si se requiere una autorizacin previa para que su compaa de seguros [place] su medicamento, por favor permtanos de 1 a 2 das hbiles para completar [address]  Los precios de los medicamentos varan con frecuencia dependiendo del Environmental consultant de dnde se surte la receta y alguna farmacias pueden ofrecer precios ms baratos.  El sitio web www.goodrx.com tiene cupones para medicamentos de Health and safety inspector.  Los precios aqu no tienen en cuenta lo que podra costar con la ayuda del seguro (puede ser ms barato con su seguro), pero el sitio web puede darle el precio si no utiliz Tourist information centre manager.  - Puede imprimir el cupn correspondiente y llevarlo con su receta a la farmacia.  - Tambin puede pasar por nuestra oficina durante el horario de atencin regular y Education officer, museum una tarjeta de cupones de GoodRx.  - Si necesita que su receta se enve electrnicamente a una farmacia diferente, informe a nuestra oficina a travs de MyChart de [place] o por telfono llamando al [phone] y presione la opcin 4.

## 2023-08-28 IMAGING — NM NM BONE W/ SPECT
1 of 2 series · 2 of 14 positions shown, 3 images · non-contrast
Comparison: None available.

CLINICAL DATA: Status post left knee arthroplasty. Patient
complains of swelling with movement. Intermittent pain.

EXAM:
NM BONE SCAN AND SPECT IMAGING
TECHNIQUE: After intravenous injection of radiopharmaceutical, delayed planar
images were obtained in multiple projections. Additionally, delayed
triplanar SPECT images were obtained through the area of interest.
RADIOPHARMACEUTICALS:  21.4 mCi Sc-UUm MDP

[Series 2: ac bone 5.0 b08s · axial · 0.98mm/px · z∈[+648,+1028]mm · 2 of 77 slices shown, 3 images]
[im 1/77  soft-tissue]
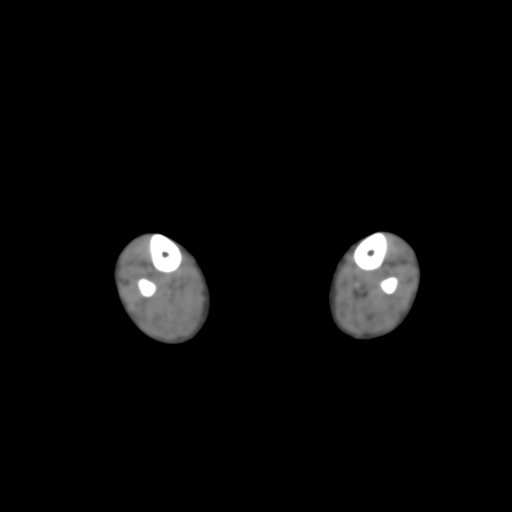
[im 1/77  bone]
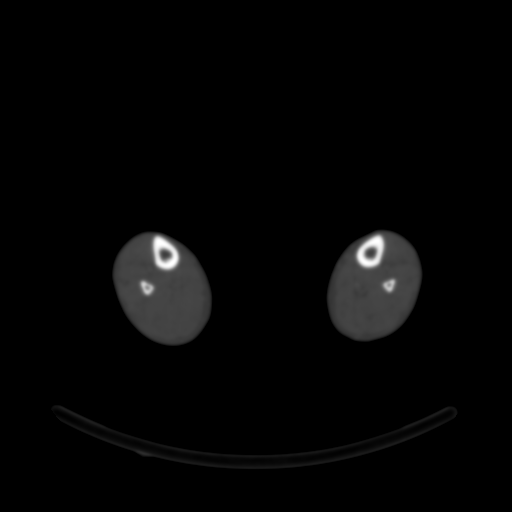
[im 77/77  bone]
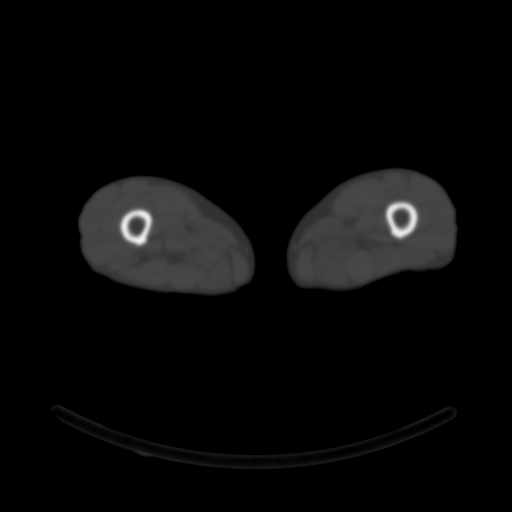

[2 of 14 positions shown; findings below may reference images not displayed]

FINDINGS: Flow phase: On the flow phase portion of the examination there is
mild asymmetric blood flow to the right knee.

Blood pool phase: On the blood pool phase portion of the exam there
is increased blood flow to the right knee.

Delayed phase: On the delayed phase imaging there is asymmetric
increased tricompartment uptake. Most severe within the medial
compartment. Imaging findings are compatible with severe
osteoarthritis.

Within the left knee there is photopenic area corresponding to
patient's known arthroplasty device. Mild nonspecific increased
uptake localizes to the patella as well as the lateral and medial
compartments.

CT: On the CT portion of the exam there is severe tricompartment
osteoarthritis noted within the right knee. A moderate size right
knee effusion is identified. The left knee arthroplasty device
appears to be in anatomic alignment. No signs of periprosthetic
fracture or loosening. Trace fluid identified within the
suprapatellar joint space.
IMPRESSION: 1. No signs to suggest left knee arthroplasty device loosening or
infection.
2. Severe right knee tricompartment osteoarthritis with moderate
joint effusion.

## 2023-09-01 ENCOUNTER — Telehealth: Payer: Self-pay

## 2023-09-01 ENCOUNTER — Ambulatory Visit
Admission: RE | Admit: 2023-09-01 | Discharge: 2023-09-01 | Disposition: A | Discharge status: Home / Self Care | Payer: Medicare HMO | Source: Ambulatory Visit | Attending: Radiation Oncology | Admitting: Radiation Oncology

## 2023-09-01 ENCOUNTER — Encounter: Payer: Self-pay | Admitting: Radiation Oncology

## 2023-09-01 VITALS — BP 139/72 | HR 55 | Temp 98.4°F | Resp 16 | Ht 72.0 in | Wt 183.0 lb

## 2023-09-01 DIAGNOSIS — Z87891 Personal history of nicotine dependence: Secondary | ICD-10-CM | POA: Diagnosis not present

## 2023-09-01 DIAGNOSIS — C61 Malignant neoplasm of prostate: Secondary | ICD-10-CM | POA: Insufficient documentation

## 2023-09-01 DIAGNOSIS — Z79899 Other long term (current) drug therapy: Secondary | ICD-10-CM | POA: Diagnosis not present

## 2023-09-01 DIAGNOSIS — I10 Essential (primary) hypertension: Secondary | ICD-10-CM | POA: Diagnosis not present

## 2023-09-01 DIAGNOSIS — G4700 Insomnia, unspecified: Secondary | ICD-10-CM | POA: Diagnosis not present

## 2023-09-01 NOTE — Telephone Encounter (Signed)
LMTRC- Needs markers and Eligard.

## 2023-09-01 NOTE — Consult Note (Signed)
NEW PATIENT EVALUATION  Name: Alejandro Stuart  MRN: [id number]  Date:   09/01/2023     DOB: 04-05-1946   This 77 y.o. 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  REFERRING PHYSICIAN: Lauro Regulus, MD  CHIEF COMPLAINT:  Chief Complaint  Patient presents with   Prostate Cancer    DIAGNOSIS: The encounter diagnosis was Prostate cancer [facility]).   PREVIOUS INVESTIGATIONS:  MRI scans reviewed Pathology reports reviewed Clinical notes reviewed  HPI: Patient is a 77 year old male who was diagnosed approximately 3 years prior with prostate cancer when his PSA was in the 5-6 range.  At that time he had low risk prostate cancer and active surveillance was initiated.  His PSA significantly increased over the last 6 months up to 10.2 in April and 12 in June 2024.  Repeat MRI scan was performed showing 2 PI-RADS 4 lesions in the peripheral zone.  He also prostamegaly with his prostate volume being 48.6 cc.  There was no evidence of trans capsular spread seminal vesicle involvement or pelvic adenopathy or metastatic disease in the pelvis.  Based on his findings he underwent repeat biopsy showing 1 core with low risk disease however there was a single core of Gleason 7 (4+3) adenocarcinoma.  There was 30% core involvement.  Patient has had a history of urine retention back in June 2023 after hip replacement although that has resolved.  He is seen today for radiation oncology opinion.  He states he has very little side effect specifically denies nocturia frequency or urgency of urination diarrhea or bone pain.  Patient has had multiple orthopedic surgeries including shoulders hips and is scheduled for right knee.  PLANNED TREATMENT REGIMEN: IMRT radiation therapy to his prostate  PAST MEDICAL HISTORY:  has a past medical history of Collagenous colitis, Hypertension, Insomnia, Prostate cancer  (HCC), and Sinus bradycardia.    PAST SURGICAL HISTORY:  Past Surgical History:  Procedure Laterality Date   KNEE ARTHROSCOPY      FAMILY HISTORY: family history includes Prostate cancer in his father; Stroke in his father.  SOCIAL HISTORY:  reports that he quit smoking about 36 years ago. His smoking use included cigarettes. He has been exposed to tobacco smoke. He has never used smokeless tobacco. He reports current alcohol use of about 7.0 standard drinks of alcohol per week. He reports that he does not use drugs.  ALLERGIES: Other and Statins  MEDICATIONS:  Current Outpatient Medications  Medication Sig Dispense Refill   EQ SENNA-S 8.6-50 MG tablet TAKE 2 TABLETS BY MOUTH IN THE MORNING     losartan (COZAAR) 50 MG tablet Take 25 mg by mouth daily.     mirtazapine (REMERON) 7.5 MG tablet Take 7.5 mg by mouth at bedtime.     sildenafil (VIAGRA) 50 MG tablet Take 1-2 tablets (50-100 mg total) by mouth as needed. Take 1-2 tablets on empty stomach 30-45 minutes prior to sexual activity 30 tablet 6   triamcinolone cream (KENALOG) 0.1 % Apply 1 Application topically 2 (two) times daily as needed. Avoid applying to face, groin, and axilla. Use as directed. Long-term use can cause thinning of the skin. 80 g 2   UNABLE TO FIND fluorouracil 5% + calcipotriene 0.005% Apply thin layer twice daily to scalp for 3-5 days as directed     zolpidem (AMBIEN) 10 MG tablet Take 5 mg by mouth at bedtime. Take 1/2 tablet by mouth nightly  No current facility-administered medications for this encounter.    ECOG PERFORMANCE STATUS:  0 - Asymptomatic  REVIEW OF SYSTEMS: Patient denies any weight loss, fatigue, weakness, fever, chills or night sweats. Patient denies any loss of vision, blurred vision. Patient denies any ringing  of the ears or hearing loss. No irregular heartbeat. Patient denies heart murmur or history of fainting. Patient denies any chest pain or pain radiating to her upper extremities.  Patient denies any shortness of breath, difficulty breathing at night, cough or hemoptysis. Patient denies any swelling in the lower legs. Patient denies any nausea vomiting, vomiting of blood, or coffee ground material in the vomitus. Patient denies any stomach pain. Patient states has had normal bowel movements no significant constipation or diarrhea. Patient denies any dysuria, hematuria or significant nocturia. Patient denies any problems walking, swelling in the joints or loss of balance. Patient denies any skin changes, loss of hair or loss of weight. Patient denies any excessive worrying or anxiety or significant depression. Patient denies any problems with insomnia. Patient denies excessive thirst, polyuria, polydipsia. Patient denies any swollen glands, patient denies easy bruising or easy bleeding. Patient denies any recent infections, allergies or URI. Patient "s visual fields have not changed significantly in recent time.   PHYSICAL EXAM: BP 139/72   Pulse (!) 55   Temp 98.4 F (36.9 C)   Resp 16   Ht 6' (1.829 m)   Wt 183 lb (83 kg)   BMI 24.82 kg/m  Well-developed well-nourished patient in NAD. HEENT reveals PERLA, EOMI, discs not visualized.  Oral cavity is clear. No oral mucosal lesions are identified. Neck is clear without evidence of cervical or supraclavicular adenopathy. Lungs are clear to A&P. Cardiac examination is essentially unremarkable with regular rate and rhythm without murmur rub or thrill. Abdomen is benign with no organomegaly or masses noted. Motor sensory and DTR levels are equal and symmetric in the upper and lower extremities. Cranial nerves II through XII are grossly intact. Proprioception is intact. No peripheral adenopathy or edema is identified. No motor or sensory levels are noted. Crude visual fields are within normal range.  LABORATORY DATA: Pathology reports reviewed    RADIOLOGY RESULTS: MRI scans reviewed   IMPRESSION: Unfavorable intermediate risk  Gleason 7 (4+3) adenocarcinoma the prostate in 77 year old male currently under active surveillance  PLAN: Patient and I had a long discussion today about continuing active surveillance at this time with his jump in PSA as well as unfavorable Gleason score.  My major concerns and progressing to bone metastasis in the near future should we not address his prostate cancer.  I have recommended IMRT radiation therapy to his prostate image guided.  Will plan on delivering 80 Gray over 8 weeks.  I have asked Dr. Signa Kell to place fiducial markers in his prostate for daily image guided treatment.  I have also asked him to start him on Eligard.  Patient is going on vacation will be back in about a month's time but that time we will have his markers placed and we will schedule him for simulation.  Risks and benefits of treatment including increased lower urinary tract symptoms diarrhea fatigue alteration blood counts all were discussed in detail with the patient.  He comprehends my recommendations well.  Macro thanks  Carmina Miller, MD

## 2023-09-03 NOTE — Telephone Encounter (Signed)
LMTRC

## 2023-09-04 ENCOUNTER — Telehealth: Payer: Self-pay

## 2023-09-04 NOTE — Telephone Encounter (Signed)
Prior authorization form for [facility] faxed for Eligard, awaiting response.

## 2023-09-04 NOTE — Telephone Encounter (Signed)
-----   Message from Nurse Cyril Mourning sent at 09/01/2023 11:29 AM EST ----- Patient needs appointment for Eligard and marker placement he has the markers. He has a trip planned after Christmas we are aware that he will be delayed. Thanks, [employer]

## 2023-09-05 NOTE — Telephone Encounter (Signed)
PA approved.   Dates: 09/04/23 - 09/03/24  Auth#[id number]

## 2023-09-05 NOTE — Telephone Encounter (Signed)
Called pt and he is not ready schedule an  appt at this time.   He would like a call the 2nd week of Jan 25.

## 2023-10-01 ENCOUNTER — Ambulatory Visit: Payer: Medicare HMO | Admitting: Dermatology

## 2023-10-01 ENCOUNTER — Encounter: Payer: Self-pay | Admitting: Dermatology

## 2023-10-01 DIAGNOSIS — W098XXA Fall on or from other playground equipment, initial encounter: Secondary | ICD-10-CM

## 2023-10-01 DIAGNOSIS — L570 Actinic keratosis: Secondary | ICD-10-CM | POA: Diagnosis not present

## 2023-10-01 DIAGNOSIS — D044 Carcinoma in situ of skin of scalp and neck: Secondary | ICD-10-CM

## 2023-10-01 DIAGNOSIS — C4492 Squamous cell carcinoma of skin, unspecified: Secondary | ICD-10-CM

## 2023-10-01 DIAGNOSIS — D492 Neoplasm of unspecified behavior of bone, soft tissue, and skin: Secondary | ICD-10-CM

## 2023-10-01 HISTORY — DX: Squamous cell carcinoma of skin, unspecified: C44.92

## 2023-10-01 NOTE — Patient Instructions (Addendum)

## 2023-10-01 NOTE — Progress Notes (Signed)
 Follow-Up Visit   Subjective  Alejandro Stuart is a 78 y.o. male who presents for the following: Patient here to have some some irritated  spots on his scalp,face and arms removed today.    The following portions of the chart were reviewed this encounter and updated as appropriate: medications, allergies, medical history  Review of Systems:  No other skin or systemic complaints except as noted in HPI or Assessment and Plan.  Objective  Well appearing patient in no apparent distress; mood and affect are within normal limits.   A focused examination was performed of the following areas:face,ears, scalp,forearms,hands     Relevant exam findings are noted in the Assessment and Plan.   left parietal scalp 0.8 cm keratotic papule  scalp x 7, right doral hand x 4, right forearm x 3, left dorsal hand x 2, left dorsal forearm x 5 (21) Erythematous thin papules/macules with gritty scale.                    Assessment & Plan     NEOPLASM OF SKIN left parietal scalp Skin / nail biopsy Type of biopsy: tangential   Informed consent: discussed and consent obtained   Patient was prepped and draped in usual sterile fashion: area prepped with alochol. Anesthesia: the lesion was anesthetized in a standard fashion   Anesthetic:  1% lidocaine w/ epinephrine 1-100,000 buffered w/ 8.4% NaHCO3 Instrument used: flexible razor blade   Hemostasis achieved with: pressure, aluminum chloride and electrodesiccation   Outcome: patient tolerated procedure well   Post-procedure details: wound care instructions given   Post-procedure details comment:  Ointment and small bandage Specimen 1 - Surgical pathology Differential Diagnosis: R/O SCC  Check Margins: No AK (ACTINIC KERATOSIS) (21) scalp x 7, right doral hand x 4, right forearm x 3, left dorsal hand x 2, left dorsal forearm x 5 (21) Actinic keratoses are precancerous spots that appear secondary to cumulative UV radiation  exposure/sun exposure over time. They are chronic with expected duration over 1 year. A portion of actinic keratoses will progress to squamous cell carcinoma of the skin. It is not possible to reliably predict which spots will progress to skin cancer and so treatment is recommended to prevent development of skin cancer.  Recommend daily broad spectrum sunscreen SPF 30+ to sun-exposed areas, reapply every 2 hours as needed.  Recommend staying in the shade or wearing long sleeves, sun glasses (UVA+UVB protection) and wide brim hats (4-inch brim around the entire circumference of the hat). Call for new or changing lesions.  Destruction of lesion - scalp x 7, right doral hand x 4, right forearm x 3, left dorsal hand x 2, left dorsal forearm x 5 (21)  Destruction method: cryotherapy   Informed consent: discussed and consent obtained   Lesion destroyed using liquid nitrogen: Yes   Region frozen until ice ball extended beyond lesion: Yes   Outcome: patient tolerated procedure well with no complications   Post-procedure details: wound care instructions given   Additional details:  Prior to procedure, discussed risks of blister formation, small wound, skin dyspigmentation, or rare scar following cryotherapy. Recommend Vaseline ointment to treated areas while healing.   Return in about 6 months (around 03/30/2024) for TBSE, hx of AKs .  IClara Crisp, CMA, am acting as scribe for Harris Liming, MD .   Documentation: I have reviewed the above documentation for accuracy and completeness, and I agree with the above.  Harris Liming, MD

## 2023-10-02 NOTE — Telephone Encounter (Signed)
Appt made for 12/18/23.

## 2023-10-03 LAB — SURGICAL PATHOLOGY

## 2023-10-05 ENCOUNTER — Encounter: Payer: Self-pay | Admitting: Dermatology

## 2023-10-06 ENCOUNTER — Telehealth: Payer: Self-pay

## 2023-10-06 DIAGNOSIS — D099 Carcinoma in situ, unspecified: Secondary | ICD-10-CM

## 2023-10-06 NOTE — Telephone Encounter (Signed)
-----   Message from [place] sent at 10/05/2023  9:12 PM EST ----- Diagnosis left parietal scalp :       SQUAMOUS CELL CARCINOMA IN SITU     Sent MyChart message

## 2023-10-06 NOTE — Telephone Encounter (Signed)
Advised pt of bx results.  Pt prefers m[facility]s at [facility] for treatment.  Referral sent to Dr. Winfield Cunas

## 2023-12-18 ENCOUNTER — Ambulatory Visit: Payer: Self-pay | Admitting: Urology

## 2023-12-18 ENCOUNTER — Telehealth: Payer: Self-pay | Admitting: Radiation Oncology

## 2023-12-18 VITALS — BP 163/77 | HR 54

## 2023-12-18 DIAGNOSIS — Z792 Long term (current) use of antibiotics: Secondary | ICD-10-CM

## 2023-12-18 DIAGNOSIS — C61 Malignant neoplasm of prostate: Secondary | ICD-10-CM | POA: Diagnosis not present

## 2023-12-18 DIAGNOSIS — Z2989 Encounter for other specified prophylactic measures: Secondary | ICD-10-CM | POA: Diagnosis not present

## 2023-12-18 MED ORDER — LEVOFLOXACIN 500 MG PO TABS
500.0000 mg | ORAL_TABLET | Freq: Once | ORAL | Status: AC
  Administered 2023-12-18: 500 mg via ORAL

## 2023-12-18 MED ORDER — GENTAMICIN SULFATE 40 MG/ML IJ SOLN
80.0000 mg | Freq: Once | INTRAMUSCULAR | Status: AC
  Administered 2023-12-18: 80 mg via INTRAMUSCULAR

## 2023-12-18 MED ORDER — LEUPROLIDE ACETATE (6 MONTH) 45 MG ~~LOC~~ KIT
45.0000 mg | PACK | Freq: Once | SUBCUTANEOUS | Status: AC
  Administered 2023-12-18: 45 mg via SUBCUTANEOUS

## 2023-12-18 NOTE — Progress Notes (Signed)
 Eligard SubQ Injection   Due to Prostate Cancer patient is present today for a Eligard Injection.  Medication: Eligard 6 month Dose: 45 mg  Location: right arm Lot: [id number] Exp: 05/16/2025  Patient tolerated well, no complications were noted  Performed by: Debbe Bales, CMA (AAMA)   Per Dr. Richardo Hanks patient is to receive a one time dose. Patient was given a reminder to take Vitamin D 800-1000iu and Calcium 1000-1200mg  daily while on Androgen Deprivation Therapy.  PA approval dates: 09/04/23 - 09/03/24

## 2023-12-18 NOTE — Telephone Encounter (Signed)
 This patient left a message with answering service to cancel his appt 4/7 and call him to reschedule. It's For CT Simulation [phone]

## 2023-12-18 NOTE — Patient Instructions (Signed)
 Please take Vitamin D 800-1000iu and Calcium 1000-1200mg  daily while on Androgen Deprivation Therapy (Eligard).

## 2023-12-18 NOTE — Progress Notes (Signed)
   12/18/23  Indication: Unfavorable intermediate risk prostate cancer  Prostate gold seed marker procedure   Informed consent was obtained, and we discussed the risks of bleeding and infection/sepsis. A time out was performed to ensure correct patient identity.  Pre-Procedure: - Gentamicin and levaquin given for antibiotic prophylaxis  Procedure: - Transrectal Ultrasound used to place gold seed markers bilaterally at the base, and centrally at the apex(3 total)  Post-Procedure: - Patient tolerated the procedure well - He was counseled to seek immediate medical attention if experiences significant bleeding, fevers, or severe pain  Assessment/ Plan: One-time dose of 6 months ADT injection given today Will proceed with XRT starting May 2025 RTC August/September 2025 with PSA prior  Legrand Rams, MD 12/18/2023

## 2023-12-22 ENCOUNTER — Ambulatory Visit: Payer: Medicare HMO

## 2024-01-27 ENCOUNTER — Ambulatory Visit

## 2024-01-28 ENCOUNTER — Ambulatory Visit
Admission: RE | Admit: 2024-01-28 | Discharge: 2024-01-28 | Disposition: A | Discharge status: Home / Self Care | Source: Ambulatory Visit | Attending: Radiation Oncology | Admitting: Radiation Oncology

## 2024-01-28 DIAGNOSIS — Z87891 Personal history of nicotine dependence: Secondary | ICD-10-CM | POA: Insufficient documentation

## 2024-01-28 DIAGNOSIS — G4700 Insomnia, unspecified: Secondary | ICD-10-CM | POA: Insufficient documentation

## 2024-01-28 DIAGNOSIS — Z79899 Other long term (current) drug therapy: Secondary | ICD-10-CM | POA: Insufficient documentation

## 2024-01-28 DIAGNOSIS — I10 Essential (primary) hypertension: Secondary | ICD-10-CM | POA: Insufficient documentation

## 2024-01-28 DIAGNOSIS — C61 Malignant neoplasm of prostate: Secondary | ICD-10-CM | POA: Insufficient documentation

## 2024-01-31 DIAGNOSIS — C61 Malignant neoplasm of prostate: Secondary | ICD-10-CM | POA: Diagnosis not present

## 2024-02-04 ENCOUNTER — Ambulatory Visit
Admission: RE | Admit: 2024-02-04 | Discharge: 2024-02-04 | Disposition: A | Discharge status: Home / Self Care | Source: Ambulatory Visit | Attending: Radiation Oncology | Admitting: Radiation Oncology

## 2024-02-04 ENCOUNTER — Other Ambulatory Visit: Payer: Self-pay | Admitting: *Deleted

## 2024-02-04 DIAGNOSIS — C61 Malignant neoplasm of prostate: Secondary | ICD-10-CM

## 2024-02-05 ENCOUNTER — Ambulatory Visit
Admission: RE | Admit: 2024-02-05 | Discharge: 2024-02-05 | Disposition: A | Discharge status: Home / Self Care | Source: Ambulatory Visit | Attending: Radiation Oncology | Admitting: Radiation Oncology

## 2024-02-05 ENCOUNTER — Other Ambulatory Visit: Payer: Self-pay

## 2024-02-05 DIAGNOSIS — C61 Malignant neoplasm of prostate: Secondary | ICD-10-CM | POA: Diagnosis not present

## 2024-02-05 LAB — RAD ONC ARIA SESSION SUMMARY
Course Elapsed Days: 0
Plan Fractions Treated to Date: 1
Plan Prescribed Dose Per Fraction: 2.5 Gy
Plan Total Fractions Prescribed: 28
Plan Total Prescribed Dose: 70 Gy
Reference Point Dosage Given to Date: 2.5 Gy
Reference Point Session Dosage Given: 2.5 Gy
Session Number: 1

## 2024-02-06 ENCOUNTER — Other Ambulatory Visit: Payer: Self-pay

## 2024-02-06 ENCOUNTER — Ambulatory Visit
Admission: RE | Admit: 2024-02-06 | Discharge: 2024-02-06 | Disposition: A | Discharge status: Home / Self Care | Source: Ambulatory Visit | Attending: Radiation Oncology | Admitting: Radiation Oncology

## 2024-02-06 DIAGNOSIS — C61 Malignant neoplasm of prostate: Secondary | ICD-10-CM | POA: Diagnosis not present

## 2024-02-06 LAB — RAD ONC ARIA SESSION SUMMARY
Course Elapsed Days: 1
Plan Fractions Treated to Date: 2
Plan Prescribed Dose Per Fraction: 2.5 Gy
Plan Total Fractions Prescribed: 28
Plan Total Prescribed Dose: 70 Gy
Reference Point Dosage Given to Date: 5 Gy
Reference Point Session Dosage Given: 2.5 Gy
Session Number: 2

## 2024-02-10 ENCOUNTER — Ambulatory Visit
Admission: RE | Admit: 2024-02-10 | Discharge: 2024-02-10 | Disposition: A | Discharge status: Home / Self Care | Source: Ambulatory Visit | Attending: Radiation Oncology | Admitting: Radiation Oncology

## 2024-02-10 ENCOUNTER — Other Ambulatory Visit: Payer: Self-pay

## 2024-02-10 DIAGNOSIS — C61 Malignant neoplasm of prostate: Secondary | ICD-10-CM | POA: Diagnosis not present

## 2024-02-10 LAB — RAD ONC ARIA SESSION SUMMARY
Course Elapsed Days: 5
Plan Fractions Treated to Date: 3
Plan Prescribed Dose Per Fraction: 2.5 Gy
Plan Total Fractions Prescribed: 28
Plan Total Prescribed Dose: 70 Gy
Reference Point Dosage Given to Date: 7.5 Gy
Reference Point Session Dosage Given: 2.5 Gy
Session Number: 3

## 2024-02-11 ENCOUNTER — Other Ambulatory Visit: Payer: Self-pay | Admitting: *Deleted

## 2024-02-11 ENCOUNTER — Ambulatory Visit
Admission: RE | Admit: 2024-02-11 | Discharge: 2024-02-11 | Disposition: A | Discharge status: Home / Self Care | Source: Ambulatory Visit | Attending: Radiation Oncology | Admitting: Radiation Oncology

## 2024-02-11 ENCOUNTER — Other Ambulatory Visit: Payer: Self-pay

## 2024-02-11 DIAGNOSIS — C61 Malignant neoplasm of prostate: Secondary | ICD-10-CM

## 2024-02-11 LAB — RAD ONC ARIA SESSION SUMMARY
Course Elapsed Days: 6
Plan Fractions Treated to Date: 4
Plan Prescribed Dose Per Fraction: 2.5 Gy
Plan Total Fractions Prescribed: 28
Plan Total Prescribed Dose: 70 Gy
Reference Point Dosage Given to Date: 10 Gy
Reference Point Session Dosage Given: 2.5 Gy
Session Number: 4

## 2024-02-12 ENCOUNTER — Other Ambulatory Visit: Payer: Self-pay

## 2024-02-12 ENCOUNTER — Ambulatory Visit
Admission: RE | Admit: 2024-02-12 | Discharge: 2024-02-12 | Disposition: A | Discharge status: Home / Self Care | Source: Ambulatory Visit | Attending: Radiation Oncology | Admitting: Radiation Oncology

## 2024-02-12 ENCOUNTER — Inpatient Hospital Stay: Attending: Radiation Oncology

## 2024-02-12 DIAGNOSIS — C61 Malignant neoplasm of prostate: Secondary | ICD-10-CM | POA: Insufficient documentation

## 2024-02-12 LAB — RAD ONC ARIA SESSION SUMMARY
Course Elapsed Days: 7
Plan Fractions Treated to Date: 5
Plan Prescribed Dose Per Fraction: 2.5 Gy
Plan Total Fractions Prescribed: 28
Plan Total Prescribed Dose: 70 Gy
Reference Point Dosage Given to Date: 12.5 Gy
Reference Point Session Dosage Given: 2.5 Gy
Session Number: 5

## 2024-02-12 LAB — CBC (CANCER CENTER ONLY)
HCT: 37 % — ABNORMAL LOW (ref 39.0–52.0)
Hemoglobin: 12.5 g/dL — ABNORMAL LOW (ref 13.0–17.0)
MCH: 31 pg (ref 26.0–34.0)
MCHC: 33.8 g/dL (ref 30.0–36.0)
MCV: 91.8 fL (ref 80.0–100.0)
Platelet Count: 165 10*3/uL (ref 150–400)
RBC: 4.03 MIL/uL — ABNORMAL LOW (ref 4.22–5.81)
RDW: 12.8 % (ref 11.5–15.5)
WBC Count: 8.3 10*3/uL (ref 4.0–10.5)
nRBC: 0 % (ref 0.0–0.2)

## 2024-02-13 ENCOUNTER — Ambulatory Visit
Admission: RE | Admit: 2024-02-13 | Discharge: 2024-02-13 | Disposition: A | Discharge status: Home / Self Care | Source: Ambulatory Visit | Attending: Radiation Oncology | Admitting: Radiation Oncology

## 2024-02-13 ENCOUNTER — Other Ambulatory Visit: Payer: Self-pay

## 2024-02-13 DIAGNOSIS — C61 Malignant neoplasm of prostate: Secondary | ICD-10-CM | POA: Diagnosis not present

## 2024-02-13 LAB — RAD ONC ARIA SESSION SUMMARY
Course Elapsed Days: 8
Plan Fractions Treated to Date: 6
Plan Prescribed Dose Per Fraction: 2.5 Gy
Plan Total Fractions Prescribed: 28
Plan Total Prescribed Dose: 70 Gy
Reference Point Dosage Given to Date: 15 Gy
Reference Point Session Dosage Given: 2.5 Gy
Session Number: 6

## 2024-02-16 ENCOUNTER — Other Ambulatory Visit: Payer: Self-pay

## 2024-02-16 ENCOUNTER — Ambulatory Visit
Admission: RE | Admit: 2024-02-16 | Discharge: 2024-02-16 | Disposition: A | Discharge status: Home / Self Care | Source: Ambulatory Visit | Attending: Radiation Oncology | Admitting: Radiation Oncology

## 2024-02-16 DIAGNOSIS — I10 Essential (primary) hypertension: Secondary | ICD-10-CM | POA: Insufficient documentation

## 2024-02-16 DIAGNOSIS — Z87891 Personal history of nicotine dependence: Secondary | ICD-10-CM | POA: Insufficient documentation

## 2024-02-16 DIAGNOSIS — C61 Malignant neoplasm of prostate: Secondary | ICD-10-CM | POA: Diagnosis present

## 2024-02-16 DIAGNOSIS — G4700 Insomnia, unspecified: Secondary | ICD-10-CM | POA: Insufficient documentation

## 2024-02-16 DIAGNOSIS — Z79899 Other long term (current) drug therapy: Secondary | ICD-10-CM | POA: Diagnosis not present

## 2024-02-16 LAB — RAD ONC ARIA SESSION SUMMARY
Course Elapsed Days: 11
Plan Fractions Treated to Date: 7
Plan Prescribed Dose Per Fraction: 2.5 Gy
Plan Total Fractions Prescribed: 28
Plan Total Prescribed Dose: 70 Gy
Reference Point Dosage Given to Date: 17.5 Gy
Reference Point Session Dosage Given: 2.5 Gy
Session Number: 7

## 2024-02-17 ENCOUNTER — Ambulatory Visit
Admission: RE | Admit: 2024-02-17 | Discharge: 2024-02-17 | Disposition: A | Discharge status: Home / Self Care | Source: Ambulatory Visit | Attending: Radiation Oncology | Admitting: Radiation Oncology

## 2024-02-17 ENCOUNTER — Other Ambulatory Visit: Payer: Self-pay

## 2024-02-17 DIAGNOSIS — C61 Malignant neoplasm of prostate: Secondary | ICD-10-CM | POA: Diagnosis not present

## 2024-02-17 LAB — RAD ONC ARIA SESSION SUMMARY
Course Elapsed Days: 12
Plan Fractions Treated to Date: 8
Plan Prescribed Dose Per Fraction: 2.5 Gy
Plan Total Fractions Prescribed: 28
Plan Total Prescribed Dose: 70 Gy
Reference Point Dosage Given to Date: 20 Gy
Reference Point Session Dosage Given: 2.5 Gy
Session Number: 8

## 2024-02-18 ENCOUNTER — Ambulatory Visit
Admission: RE | Admit: 2024-02-18 | Discharge: 2024-02-18 | Disposition: A | Discharge status: Home / Self Care | Source: Ambulatory Visit | Attending: Radiation Oncology | Admitting: Radiation Oncology

## 2024-02-18 ENCOUNTER — Other Ambulatory Visit: Payer: Self-pay

## 2024-02-18 DIAGNOSIS — C61 Malignant neoplasm of prostate: Secondary | ICD-10-CM | POA: Diagnosis not present

## 2024-02-18 LAB — RAD ONC ARIA SESSION SUMMARY
Course Elapsed Days: 13
Plan Fractions Treated to Date: 9
Plan Prescribed Dose Per Fraction: 2.5 Gy
Plan Total Fractions Prescribed: 28
Plan Total Prescribed Dose: 70 Gy
Reference Point Dosage Given to Date: 22.5 Gy
Reference Point Session Dosage Given: 2.5 Gy
Session Number: 9

## 2024-02-19 ENCOUNTER — Other Ambulatory Visit: Payer: Self-pay

## 2024-02-19 ENCOUNTER — Ambulatory Visit
Admission: RE | Admit: 2024-02-19 | Discharge: 2024-02-19 | Disposition: A | Discharge status: Home / Self Care | Source: Ambulatory Visit | Attending: Radiation Oncology | Admitting: Radiation Oncology

## 2024-02-19 DIAGNOSIS — C61 Malignant neoplasm of prostate: Secondary | ICD-10-CM | POA: Diagnosis not present

## 2024-02-19 LAB — RAD ONC ARIA SESSION SUMMARY
Course Elapsed Days: 14
Plan Fractions Treated to Date: 10
Plan Prescribed Dose Per Fraction: 2.5 Gy
Plan Total Fractions Prescribed: 28
Plan Total Prescribed Dose: 70 Gy
Reference Point Dosage Given to Date: 25 Gy
Reference Point Session Dosage Given: 2.5 Gy
Session Number: 10

## 2024-02-20 ENCOUNTER — Ambulatory Visit
Admission: RE | Admit: 2024-02-20 | Discharge: 2024-02-20 | Disposition: A | Discharge status: Home / Self Care | Source: Ambulatory Visit | Attending: Radiation Oncology | Admitting: Radiation Oncology

## 2024-02-20 ENCOUNTER — Other Ambulatory Visit: Payer: Self-pay

## 2024-02-20 DIAGNOSIS — C61 Malignant neoplasm of prostate: Secondary | ICD-10-CM | POA: Diagnosis not present

## 2024-02-20 LAB — RAD ONC ARIA SESSION SUMMARY
Course Elapsed Days: 15
Plan Fractions Treated to Date: 11
Plan Prescribed Dose Per Fraction: 2.5 Gy
Plan Total Fractions Prescribed: 28
Plan Total Prescribed Dose: 70 Gy
Reference Point Dosage Given to Date: 27.5 Gy
Reference Point Session Dosage Given: 2.5 Gy
Session Number: 11

## 2024-02-23 ENCOUNTER — Ambulatory Visit
Admission: RE | Admit: 2024-02-23 | Discharge: 2024-02-23 | Disposition: A | Discharge status: Home / Self Care | Source: Ambulatory Visit | Attending: Radiation Oncology | Admitting: Radiation Oncology

## 2024-02-23 ENCOUNTER — Other Ambulatory Visit: Payer: Self-pay

## 2024-02-23 DIAGNOSIS — C61 Malignant neoplasm of prostate: Secondary | ICD-10-CM | POA: Diagnosis not present

## 2024-02-23 LAB — RAD ONC ARIA SESSION SUMMARY
Course Elapsed Days: 18
Plan Fractions Treated to Date: 12
Plan Prescribed Dose Per Fraction: 2.5 Gy
Plan Total Fractions Prescribed: 28
Plan Total Prescribed Dose: 70 Gy
Reference Point Dosage Given to Date: 30 Gy
Reference Point Session Dosage Given: 2.5 Gy
Session Number: 12

## 2024-02-24 ENCOUNTER — Other Ambulatory Visit: Payer: Self-pay

## 2024-02-24 ENCOUNTER — Ambulatory Visit
Admission: RE | Admit: 2024-02-24 | Discharge: 2024-02-24 | Disposition: A | Discharge status: Home / Self Care | Source: Ambulatory Visit | Attending: Radiation Oncology | Admitting: Radiation Oncology

## 2024-02-24 DIAGNOSIS — C61 Malignant neoplasm of prostate: Secondary | ICD-10-CM | POA: Diagnosis not present

## 2024-02-24 LAB — RAD ONC ARIA SESSION SUMMARY
Course Elapsed Days: 19
Plan Fractions Treated to Date: 13
Plan Prescribed Dose Per Fraction: 2.5 Gy
Plan Total Fractions Prescribed: 28
Plan Total Prescribed Dose: 70 Gy
Reference Point Dosage Given to Date: 32.5 Gy
Reference Point Session Dosage Given: 2.5 Gy
Session Number: 13

## 2024-02-25 ENCOUNTER — Other Ambulatory Visit: Payer: Self-pay

## 2024-02-25 ENCOUNTER — Ambulatory Visit
Admission: RE | Admit: 2024-02-25 | Discharge: 2024-02-25 | Disposition: A | Discharge status: Home / Self Care | Source: Ambulatory Visit | Attending: Radiation Oncology | Admitting: Radiation Oncology

## 2024-02-25 DIAGNOSIS — C61 Malignant neoplasm of prostate: Secondary | ICD-10-CM | POA: Diagnosis not present

## 2024-02-25 LAB — RAD ONC ARIA SESSION SUMMARY
Course Elapsed Days: 20
Plan Fractions Treated to Date: 14
Plan Prescribed Dose Per Fraction: 2.5 Gy
Plan Total Fractions Prescribed: 28
Plan Total Prescribed Dose: 70 Gy
Reference Point Dosage Given to Date: 35 Gy
Reference Point Session Dosage Given: 2.5 Gy
Session Number: 14

## 2024-02-26 ENCOUNTER — Inpatient Hospital Stay: Attending: Radiation Oncology

## 2024-02-26 ENCOUNTER — Other Ambulatory Visit: Payer: Self-pay

## 2024-02-26 ENCOUNTER — Ambulatory Visit
Admission: RE | Admit: 2024-02-26 | Discharge: 2024-02-26 | Disposition: A | Discharge status: Home / Self Care | Source: Ambulatory Visit | Attending: Radiation Oncology | Admitting: Radiation Oncology

## 2024-02-26 DIAGNOSIS — C61 Malignant neoplasm of prostate: Secondary | ICD-10-CM | POA: Insufficient documentation

## 2024-02-26 LAB — RAD ONC ARIA SESSION SUMMARY
Course Elapsed Days: 21
Plan Fractions Treated to Date: 15
Plan Prescribed Dose Per Fraction: 2.5 Gy
Plan Total Fractions Prescribed: 28
Plan Total Prescribed Dose: 70 Gy
Reference Point Dosage Given to Date: 37.5 Gy
Reference Point Session Dosage Given: 2.5 Gy
Session Number: 15

## 2024-02-26 LAB — CBC (CANCER CENTER ONLY)
HCT: 35.7 % — ABNORMAL LOW (ref 39.0–52.0)
Hemoglobin: 12.1 g/dL — ABNORMAL LOW (ref 13.0–17.0)
MCH: 31.1 pg (ref 26.0–34.0)
MCHC: 33.9 g/dL (ref 30.0–36.0)
MCV: 91.8 fL (ref 80.0–100.0)
Platelet Count: 178 10*3/uL (ref 150–400)
RBC: 3.89 MIL/uL — ABNORMAL LOW (ref 4.22–5.81)
RDW: 13.2 % (ref 11.5–15.5)
WBC Count: 9.6 10*3/uL (ref 4.0–10.5)
nRBC: 0 % (ref 0.0–0.2)

## 2024-02-27 ENCOUNTER — Other Ambulatory Visit: Payer: Self-pay

## 2024-02-27 ENCOUNTER — Ambulatory Visit
Admission: RE | Admit: 2024-02-27 | Discharge: 2024-02-27 | Disposition: A | Discharge status: Home / Self Care | Source: Ambulatory Visit | Attending: Radiation Oncology | Admitting: Radiation Oncology

## 2024-02-27 DIAGNOSIS — C61 Malignant neoplasm of prostate: Secondary | ICD-10-CM | POA: Diagnosis not present

## 2024-02-27 LAB — RAD ONC ARIA SESSION SUMMARY
Course Elapsed Days: 22
Plan Fractions Treated to Date: 16
Plan Prescribed Dose Per Fraction: 2.5 Gy
Plan Total Fractions Prescribed: 28
Plan Total Prescribed Dose: 70 Gy
Reference Point Dosage Given to Date: 40 Gy
Reference Point Session Dosage Given: 2.5 Gy
Session Number: 16

## 2024-03-01 ENCOUNTER — Ambulatory Visit
Admission: RE | Admit: 2024-03-01 | Discharge: 2024-03-01 | Disposition: A | Discharge status: Home / Self Care | Source: Ambulatory Visit | Attending: Radiation Oncology | Admitting: Radiation Oncology

## 2024-03-01 ENCOUNTER — Other Ambulatory Visit: Payer: Self-pay

## 2024-03-01 DIAGNOSIS — C61 Malignant neoplasm of prostate: Secondary | ICD-10-CM | POA: Diagnosis not present

## 2024-03-01 LAB — RAD ONC ARIA SESSION SUMMARY
Course Elapsed Days: 25
Plan Fractions Treated to Date: 17
Plan Prescribed Dose Per Fraction: 2.5 Gy
Plan Total Fractions Prescribed: 28
Plan Total Prescribed Dose: 70 Gy
Reference Point Dosage Given to Date: 42.5 Gy
Reference Point Session Dosage Given: 2.5 Gy
Session Number: 17

## 2024-03-02 ENCOUNTER — Ambulatory Visit
Admission: RE | Admit: 2024-03-02 | Discharge: 2024-03-02 | Disposition: A | Discharge status: Home / Self Care | Source: Ambulatory Visit | Attending: Radiation Oncology | Admitting: Radiation Oncology

## 2024-03-02 ENCOUNTER — Other Ambulatory Visit: Payer: Self-pay

## 2024-03-02 DIAGNOSIS — C61 Malignant neoplasm of prostate: Secondary | ICD-10-CM | POA: Diagnosis not present

## 2024-03-02 LAB — RAD ONC ARIA SESSION SUMMARY
Course Elapsed Days: 26
Plan Fractions Treated to Date: 18
Plan Prescribed Dose Per Fraction: 2.5 Gy
Plan Total Fractions Prescribed: 28
Plan Total Prescribed Dose: 70 Gy
Reference Point Dosage Given to Date: 45 Gy
Reference Point Session Dosage Given: 2.5 Gy
Session Number: 18

## 2024-03-03 ENCOUNTER — Ambulatory Visit
Admission: RE | Admit: 2024-03-03 | Discharge: 2024-03-03 | Disposition: A | Discharge status: Home / Self Care | Source: Ambulatory Visit | Attending: Radiation Oncology | Admitting: Radiation Oncology

## 2024-03-03 ENCOUNTER — Other Ambulatory Visit: Payer: Self-pay

## 2024-03-03 DIAGNOSIS — C61 Malignant neoplasm of prostate: Secondary | ICD-10-CM | POA: Diagnosis not present

## 2024-03-03 LAB — RAD ONC ARIA SESSION SUMMARY
Course Elapsed Days: 27
Plan Fractions Treated to Date: 19
Plan Prescribed Dose Per Fraction: 2.5 Gy
Plan Total Fractions Prescribed: 28
Plan Total Prescribed Dose: 70 Gy
Reference Point Dosage Given to Date: 47.5 Gy
Reference Point Session Dosage Given: 2.5 Gy
Session Number: 19

## 2024-03-04 ENCOUNTER — Inpatient Hospital Stay

## 2024-03-04 ENCOUNTER — Other Ambulatory Visit: Payer: Self-pay

## 2024-03-04 ENCOUNTER — Ambulatory Visit
Admission: RE | Admit: 2024-03-04 | Discharge: 2024-03-04 | Disposition: A | Discharge status: Home / Self Care | Source: Ambulatory Visit | Attending: Radiation Oncology | Admitting: Radiation Oncology

## 2024-03-04 DIAGNOSIS — C61 Malignant neoplasm of prostate: Secondary | ICD-10-CM | POA: Diagnosis not present

## 2024-03-04 LAB — RAD ONC ARIA SESSION SUMMARY
Course Elapsed Days: 28
Plan Fractions Treated to Date: 20
Plan Prescribed Dose Per Fraction: 2.5 Gy
Plan Total Fractions Prescribed: 28
Plan Total Prescribed Dose: 70 Gy
Reference Point Dosage Given to Date: 50 Gy
Reference Point Session Dosage Given: 2.5 Gy
Session Number: 20

## 2024-03-05 ENCOUNTER — Ambulatory Visit
Admission: RE | Admit: 2024-03-05 | Discharge: 2024-03-05 | Disposition: A | Discharge status: Home / Self Care | Source: Ambulatory Visit | Attending: Radiation Oncology | Admitting: Radiation Oncology

## 2024-03-05 ENCOUNTER — Other Ambulatory Visit: Payer: Self-pay

## 2024-03-05 DIAGNOSIS — C61 Malignant neoplasm of prostate: Secondary | ICD-10-CM | POA: Diagnosis not present

## 2024-03-05 LAB — RAD ONC ARIA SESSION SUMMARY
Course Elapsed Days: 29
Plan Fractions Treated to Date: 21
Plan Prescribed Dose Per Fraction: 2.5 Gy
Plan Total Fractions Prescribed: 28
Plan Total Prescribed Dose: 70 Gy
Reference Point Dosage Given to Date: 52.5 Gy
Reference Point Session Dosage Given: 2.5 Gy
Session Number: 21

## 2024-03-08 ENCOUNTER — Other Ambulatory Visit: Payer: Self-pay | Admitting: Internal Medicine

## 2024-03-08 ENCOUNTER — Ambulatory Visit
Admission: RE | Admit: 2024-03-08 | Discharge: 2024-03-08 | Disposition: A | Discharge status: Home / Self Care | Source: Ambulatory Visit | Attending: Radiation Oncology | Admitting: Radiation Oncology

## 2024-03-08 ENCOUNTER — Other Ambulatory Visit: Payer: Self-pay

## 2024-03-08 DIAGNOSIS — C61 Malignant neoplasm of prostate: Secondary | ICD-10-CM | POA: Diagnosis not present

## 2024-03-08 DIAGNOSIS — R2231 Localized swelling, mass and lump, right upper limb: Secondary | ICD-10-CM

## 2024-03-08 LAB — RAD ONC ARIA SESSION SUMMARY
Course Elapsed Days: 32
Plan Fractions Treated to Date: 22
Plan Prescribed Dose Per Fraction: 2.5 Gy
Plan Total Fractions Prescribed: 28
Plan Total Prescribed Dose: 70 Gy
Reference Point Dosage Given to Date: 55 Gy
Reference Point Session Dosage Given: 2.5 Gy
Session Number: 22

## 2024-03-09 ENCOUNTER — Other Ambulatory Visit: Payer: Self-pay

## 2024-03-09 ENCOUNTER — Ambulatory Visit
Admission: RE | Admit: 2024-03-09 | Discharge: 2024-03-09 | Disposition: A | Discharge status: Home / Self Care | Source: Ambulatory Visit | Attending: Radiation Oncology | Admitting: Radiation Oncology

## 2024-03-09 DIAGNOSIS — C61 Malignant neoplasm of prostate: Secondary | ICD-10-CM | POA: Diagnosis not present

## 2024-03-09 LAB — RAD ONC ARIA SESSION SUMMARY
Course Elapsed Days: 33
Plan Fractions Treated to Date: 23
Plan Prescribed Dose Per Fraction: 2.5 Gy
Plan Total Fractions Prescribed: 28
Plan Total Prescribed Dose: 70 Gy
Reference Point Dosage Given to Date: 57.5 Gy
Reference Point Session Dosage Given: 2.5 Gy
Session Number: 23

## 2024-03-10 ENCOUNTER — Other Ambulatory Visit: Payer: Self-pay

## 2024-03-10 ENCOUNTER — Ambulatory Visit
Admission: RE | Admit: 2024-03-10 | Discharge: 2024-03-10 | Disposition: A | Discharge status: Home / Self Care | Source: Ambulatory Visit | Attending: Radiation Oncology | Admitting: Radiation Oncology

## 2024-03-10 DIAGNOSIS — C61 Malignant neoplasm of prostate: Secondary | ICD-10-CM | POA: Diagnosis not present

## 2024-03-10 LAB — RAD ONC ARIA SESSION SUMMARY
Course Elapsed Days: 34
Plan Fractions Treated to Date: 24
Plan Prescribed Dose Per Fraction: 2.5 Gy
Plan Total Fractions Prescribed: 28
Plan Total Prescribed Dose: 70 Gy
Reference Point Dosage Given to Date: 60 Gy
Reference Point Session Dosage Given: 1.6275 Gy
Session Number: 24

## 2024-03-11 ENCOUNTER — Other Ambulatory Visit: Payer: Self-pay

## 2024-03-11 ENCOUNTER — Ambulatory Visit
Admission: RE | Admit: 2024-03-11 | Discharge: 2024-03-11 | Disposition: A | Discharge status: Home / Self Care | Source: Ambulatory Visit | Attending: Radiation Oncology | Admitting: Radiation Oncology

## 2024-03-11 ENCOUNTER — Inpatient Hospital Stay

## 2024-03-11 DIAGNOSIS — C61 Malignant neoplasm of prostate: Secondary | ICD-10-CM | POA: Diagnosis not present

## 2024-03-11 LAB — RAD ONC ARIA SESSION SUMMARY
Course Elapsed Days: 35
Plan Fractions Treated to Date: 25
Plan Prescribed Dose Per Fraction: 2.5 Gy
Plan Total Fractions Prescribed: 28
Plan Total Prescribed Dose: 70 Gy
Reference Point Dosage Given to Date: 62.5 Gy
Reference Point Session Dosage Given: 2.5 Gy
Session Number: 25

## 2024-03-12 ENCOUNTER — Ambulatory Visit
Admission: RE | Admit: 2024-03-12 | Discharge: 2024-03-12 | Disposition: A | Discharge status: Home / Self Care | Source: Ambulatory Visit | Attending: Radiation Oncology | Admitting: Radiation Oncology

## 2024-03-12 ENCOUNTER — Other Ambulatory Visit: Payer: Self-pay

## 2024-03-12 DIAGNOSIS — C61 Malignant neoplasm of prostate: Secondary | ICD-10-CM | POA: Diagnosis not present

## 2024-03-12 LAB — RAD ONC ARIA SESSION SUMMARY
Course Elapsed Days: 36
Plan Fractions Treated to Date: 26
Plan Prescribed Dose Per Fraction: 2.5 Gy
Plan Total Fractions Prescribed: 28
Plan Total Prescribed Dose: 70 Gy
Reference Point Dosage Given to Date: 65 Gy
Reference Point Session Dosage Given: 2.5 Gy
Session Number: 26

## 2024-03-15 ENCOUNTER — Other Ambulatory Visit: Payer: Self-pay

## 2024-03-15 ENCOUNTER — Ambulatory Visit
Admission: RE | Admit: 2024-03-15 | Discharge: 2024-03-15 | Disposition: A | Discharge status: Home / Self Care | Source: Ambulatory Visit | Attending: Radiation Oncology | Admitting: Radiation Oncology

## 2024-03-15 DIAGNOSIS — C61 Malignant neoplasm of prostate: Secondary | ICD-10-CM | POA: Diagnosis not present

## 2024-03-15 LAB — RAD ONC ARIA SESSION SUMMARY
Course Elapsed Days: 39
Plan Fractions Treated to Date: 27
Plan Prescribed Dose Per Fraction: 2.5 Gy
Plan Total Fractions Prescribed: 28
Plan Total Prescribed Dose: 70 Gy
Reference Point Dosage Given to Date: 67.5 Gy
Reference Point Session Dosage Given: 2.5 Gy
Session Number: 27

## 2024-03-16 ENCOUNTER — Encounter: Payer: Self-pay | Admitting: Ophthalmology

## 2024-03-16 ENCOUNTER — Ambulatory Visit
Admission: RE | Admit: 2024-03-16 | Discharge: 2024-03-16 | Disposition: A | Discharge status: Home / Self Care | Source: Ambulatory Visit | Attending: Radiation Oncology | Admitting: Radiation Oncology

## 2024-03-16 ENCOUNTER — Other Ambulatory Visit: Payer: Self-pay

## 2024-03-16 DIAGNOSIS — C61 Malignant neoplasm of prostate: Secondary | ICD-10-CM | POA: Diagnosis present

## 2024-03-16 LAB — RAD ONC ARIA SESSION SUMMARY
Course Elapsed Days: 40
Plan Fractions Treated to Date: 28
Plan Prescribed Dose Per Fraction: 2.5 Gy
Plan Total Fractions Prescribed: 28
Plan Total Prescribed Dose: 70 Gy
Reference Point Dosage Given to Date: 70 Gy
Reference Point Session Dosage Given: 2.5 Gy
Session Number: 28

## 2024-03-17 ENCOUNTER — Ambulatory Visit

## 2024-03-17 NOTE — Radiation Completion Notes (Signed)
 Patient Name: Alejandro Stuart, Alejandro Stuart MRN: [id number] Date of Birth: 07-13-1946 Referring Physician: LAYMAN PIETY, M.D. Date of Service: 2024-03-17 Radiation Oncologist: [name], M.D. [facility]                             RADIATION ONCOLOGY END OF TREATMENT NOTE     Diagnosis: C61 Malignant neoplasm of prostate Intent: Curative     HPI: Patient is a 78 year old male who was diagnosed approximately 3 years prior with prostate cancer when his PSA was in the 5-6 range.  At that time he had low risk prostate cancer and active surveillance was initiated.  His PSA significantly increased over the last 6 months up to 10.2 in April and 12 in June 2024.  Repeat MRI scan was performed showing 2 PI-RADS 4 lesions in the peripheral zone.  He also prostamegaly with his prostate volume being 48.6 cc.  There was no evidence of trans capsular spread seminal vesicle involvement or pelvic adenopathy or metastatic disease in the pelvis.  Based on his findings he underwent repeat biopsy showing 1 core with low risk disease however there was a single core of Gleason 7 (4+3) adenocarcinoma.  There was 30% core involvement.  Patient has had a history of urine retention back in June 2023 after hip replacement although that has resolved.  He is seen today for radiation oncology opinion.  He states he has very little side effect specifically denies nocturia frequency or urgency of urination diarrhea or bone pain.  Patient has had multiple orthopedic surgeries including shoulders hips and is scheduled for right knee.      ==========DELIVERED PLANS==========  First Treatment Date: 2024-02-05 Last Treatment Date: 2024-03-16   Plan Name: Prostate Site: Prostate Technique: IMRT Mode: Photon Dose Per Fraction: 2.5 Gy Prescribed Dose (Delivered / Prescribed): 70 Gy / 70 Gy Prescribed Fxs (Delivered / Prescribed): 28 / 28     ==========ON TREATMENT VISIT DATES========== 2024-02-10,  2024-02-17, 2024-02-24, 2024-03-02, 2024-03-09, 2024-03-16     ==========UPCOMING VISITS==========       ==========APPENDIX - ON TREATMENT VISIT NOTES==========   See weekly On Treatment Notes in Epic for details in the Media tab (listed as Progress notes on the On Treatment Visit Dates listed above).

## 2024-03-18 ENCOUNTER — Ambulatory Visit

## 2024-03-18 ENCOUNTER — Inpatient Hospital Stay

## 2024-03-18 NOTE — Anesthesia Preprocedure Evaluation (Addendum)
 Anesthesia Evaluation  Patient identified by MRN, date of birth, ID band Patient awake    Reviewed: Allergy & Precautions, NPO status , Patient's Chart, lab work & pertinent test results  History of Anesthesia Complications (+) history of anesthetic complications (hx urinary retention s/p spinal anesthesia)  Airway Mallampati: IV   Neck ROM: Full    Dental  (+) Missing, Chipped   Pulmonary former smoker (quit 1988)   Pulmonary exam normal breath sounds clear to auscultation       Cardiovascular hypertension, Normal cardiovascular exam Rhythm:Regular Rate:Normal     Neuro/Psych negative neurological ROS     GI/Hepatic negative GI ROS,,,  Endo/Other  negative endocrine ROS    Renal/GU      Musculoskeletal   Abdominal   Peds  Hematology negative hematology ROS (+)   Anesthesia Other Findings   Reproductive/Obstetrics                              Anesthesia Physical Anesthesia Plan  ASA: 2  Anesthesia Plan: MAC   Post-op Pain Management:    Induction: Intravenous  PONV Risk Score and Plan: 1 and Treatment may vary due to age or medical condition, Midazolam  and TIVA  Airway Management Planned: Natural Airway and Nasal Cannula  Additional Equipment:   Intra-op Plan:   Post-operative Plan:   Informed Consent: I have reviewed the patients History and Physical, chart, labs and discussed the procedure including the risks, benefits and alternatives for the proposed anesthesia with the patient or authorized representative who has indicated his/her understanding and acceptance.     Dental advisory given  Plan Discussed with: CRNA  Anesthesia Plan Comments: (LMA/GETA backup discussed.  Patient consented for risks of anesthesia including but not limited to:  - adverse reactions to medications - damage to eyes, teeth, lips or other oral mucosa - nerve damage due to positioning  -  sore throat or hoarseness - damage to heart, brain, nerves, lungs, other parts of body or loss of life  Informed patient about role of CRNA in peri- and intra-operative care.  Patient voiced understanding.)        Anesthesia Quick Evaluation

## 2024-03-22 ENCOUNTER — Ambulatory Visit

## 2024-03-22 NOTE — Discharge Instructions (Signed)

## 2024-03-23 ENCOUNTER — Ambulatory Visit

## 2024-03-24 ENCOUNTER — Ambulatory Visit

## 2024-03-25 ENCOUNTER — Ambulatory Visit

## 2024-03-26 ENCOUNTER — Ambulatory Visit

## 2024-03-29 ENCOUNTER — Ambulatory Visit

## 2024-03-29 MED ORDER — TETRACAINE HCL 0.5 % OP SOLN
1.0000 [drp] | OPHTHALMIC | Status: DC | PRN
  Administered 2024-03-30 (×3): 1 [drp] via OPHTHALMIC

## 2024-03-29 MED ORDER — ARMC OPHTHALMIC DILATING DROPS
1.0000 | OPHTHALMIC | Status: DC | PRN
  Administered 2024-03-30 (×3): 1 via OPHTHALMIC

## 2024-03-29 MED ORDER — LACTATED RINGERS IV SOLN: INTRAVENOUS | Status: DC

## 2024-03-30 ENCOUNTER — Encounter: Payer: Self-pay | Admitting: Ophthalmology

## 2024-03-30 ENCOUNTER — Encounter: Payer: Self-pay | Admitting: Anesthesiology

## 2024-03-30 ENCOUNTER — Ambulatory Visit
Admission: RE | Admit: 2024-03-30 | Discharge: 2024-03-30 | Disposition: A | Discharge status: Home / Self Care | Attending: Ophthalmology | Admitting: Ophthalmology

## 2024-03-30 ENCOUNTER — Encounter: Payer: Medicare HMO | Admitting: Dermatology

## 2024-03-30 ENCOUNTER — Ambulatory Visit: Payer: Self-pay | Admitting: Anesthesiology

## 2024-03-30 ENCOUNTER — Ambulatory Visit

## 2024-03-30 ENCOUNTER — Encounter
Admission: RE | Disposition: A | Discharge status: Home / Self Care | Payer: Self-pay | Source: Home / Self Care | Attending: Ophthalmology

## 2024-03-30 DIAGNOSIS — I10 Essential (primary) hypertension: Secondary | ICD-10-CM | POA: Insufficient documentation

## 2024-03-30 DIAGNOSIS — Z87891 Personal history of nicotine dependence: Secondary | ICD-10-CM | POA: Diagnosis not present

## 2024-03-30 DIAGNOSIS — Z823 Family history of stroke: Secondary | ICD-10-CM | POA: Diagnosis not present

## 2024-03-30 DIAGNOSIS — H2511 Age-related nuclear cataract, right eye: Secondary | ICD-10-CM | POA: Diagnosis present

## 2024-03-30 HISTORY — PX: CATARACT EXTRACTION W/PHACO: SHX586

## 2024-03-30 HISTORY — DX: Other complications of anesthesia, initial encounter: T88.59XA

## 2024-03-30 SURGERY — PHACOEMULSIFICATION, CATARACT, WITH IOL INSERTION
Anesthesia: Monitor Anesthesia Care | Site: Eye | Laterality: Right

## 2024-03-30 MED ORDER — MOXIFLOXACIN HCL 0.5 % OP SOLN
OPHTHALMIC | Status: DC | PRN
  Administered 2024-03-30: .2 mL via OPHTHALMIC

## 2024-03-30 MED ORDER — SIGHTPATH DOSE#1 BSS IO SOLN
INTRAOCULAR | Status: DC | PRN
  Administered 2024-03-30: 15 mL via INTRAOCULAR

## 2024-03-30 MED ORDER — BRIMONIDINE TARTRATE-TIMOLOL 0.2-0.5 % OP SOLN
OPHTHALMIC | Status: DC | PRN
  Administered 2024-03-30: 1 [drp] via OPHTHALMIC

## 2024-03-30 MED ORDER — FENTANYL CITRATE (PF) 100 MCG/2ML IJ SOLN
INTRAMUSCULAR | Status: AC
  Filled 2024-03-30: qty 2

## 2024-03-30 MED ORDER — MIDAZOLAM HCL 2 MG/2ML IJ SOLN
INTRAMUSCULAR | Status: DC | PRN
  Administered 2024-03-30: 2 mg via INTRAVENOUS

## 2024-03-30 MED ORDER — SIGHTPATH DOSE#1 BSS IO SOLN
INTRAOCULAR | Status: DC | PRN
  Administered 2024-03-30: 50 mL via OPHTHALMIC

## 2024-03-30 MED ORDER — LIDOCAINE HCL (PF) 2 % IJ SOLN
INTRAOCULAR | Status: DC | PRN
  Administered 2024-03-30: 2 mL

## 2024-03-30 MED ORDER — GLYCOPYRROLATE 0.2 MG/ML IJ SOLN
INTRAMUSCULAR | Status: DC | PRN
  Administered 2024-03-30: .2 mg via INTRAVENOUS

## 2024-03-30 MED ORDER — FENTANYL CITRATE (PF) 100 MCG/2ML IJ SOLN
INTRAMUSCULAR | Status: DC | PRN
  Administered 2024-03-30: 50 ug via INTRAVENOUS

## 2024-03-30 MED ORDER — SIGHTPATH DOSE#1 NA CHONDROIT SULF-NA HYALURON 40-17 MG/ML IO SOLN
INTRAOCULAR | Status: DC | PRN
  Administered 2024-03-30: 1 mL via INTRAOCULAR

## 2024-03-30 MED ORDER — MIDAZOLAM HCL 2 MG/2ML IJ SOLN
INTRAMUSCULAR | Status: AC
  Filled 2024-03-30: qty 2

## 2024-03-30 SURGICAL SUPPLY — 15 items
CANNULA ANT/CHMB 27G (MISCELLANEOUS) IMPLANT
CATARACT SUITE SIGHTPATH (MISCELLANEOUS) ×1 IMPLANT
CYSTOTOME ANGL RVRS SHRT 25G (CUTTER) ×1 IMPLANT
FEE CATARACT SUITE SIGHTPATH (MISCELLANEOUS) ×1 IMPLANT
GLOVE BIOGEL PI IND STRL 8 (GLOVE) ×1 IMPLANT
GLOVE SURG LX STRL 8.0 MICRO (GLOVE) ×1 IMPLANT
GLOVE SURG SYN 6.5 PF PI BL (GLOVE) ×1 IMPLANT
GLOVE SURG SYN 8.0 PF PI (GLOVE) IMPLANT
LENS IOL TECNIS EYHANCE 18.5 (Intraocular Lens) IMPLANT
NDL FILTER BLUNT 18X1 1/2 (NEEDLE) ×1 IMPLANT
NEEDLE FILTER BLUNT 18X1 1/2 (NEEDLE) ×1 IMPLANT
PACK VIT ANT 23G (MISCELLANEOUS) IMPLANT
RING MALYGIN (MISCELLANEOUS) IMPLANT
SUTURE EHLN 10-0 CS-B-6CS-B-6 (SUTURE) IMPLANT
SYR 3ML LL SCALE MARK (SYRINGE) ×1 IMPLANT

## 2024-03-30 NOTE — Op Note (Signed)
 PREOPERATIVE DIAGNOSIS:  Nuclear sclerotic cataract of the right eye.   POSTOPERATIVE DIAGNOSIS:  Right Eye Cataract   OPERATIVE PROCEDURE:ORPROCALL@   SURGEON:  Elsie Carmine, MD.   ANESTHESIA:  Anesthesiologist: Shellie Odor, MD CRNA: Veronica Alm BROCKS, CRNA  1.      Managed anesthesia care. 2.      0.77ml of Shugarcaine was instilled in the eye following the paracentesis.   COMPLICATIONS:  None.   TECHNIQUE:   Stop and chop   DESCRIPTION OF PROCEDURE:  The patient was examined and consented in the preoperative holding area where the aforementioned topical anesthesia was applied to the right eye and then brought back to the Operating Room where the right eye was prepped and draped in the usual sterile ophthalmic fashion and a lid speculum was placed. A paracentesis was created with the side port blade and the anterior chamber was filled with viscoelastic. A near clear corneal incision was performed with the steel keratome. A continuous curvilinear capsulorrhexis was performed with a cystotome followed by the capsulorrhexis forceps. Hydrodissection and hydrodelineation were carried out with BSS on a blunt cannula. The lens was removed in a stop and chop  technique and the remaining cortical material was removed with the irrigation-aspiration handpiece. The capsular bag was inflated with viscoelastic and the Technis [id number]  lens was placed in the capsular bag without complication. The remaining viscoelastic was removed from the eye with the irrigation-aspiration handpiece. The wounds were hydrated. The anterior chamber was flushed with BSS and the eye was inflated to physiologic pressure. 0.25ml of Vigamox  was placed in the anterior chamber. The wounds were found to be water tight. The eye was dressed with Combigan . The patient was given protective glasses to wear throughout the day and a shield with which to sleep tonight. The patient was also given drops with which to begin a drop regimen  today and will follow-up with me in one day. Implant Name Type Inv. Item Serial No. Manufacturer Lot No. LRB No. Used Action  LENS IOL TECNIS EYHANCE 18.5 - [id number] Intraocular Lens LENS IOL TECNIS EYHANCE 18.5 [id number] [employer]  Right 1 Implanted   Procedure(s): PHACOEMULSIFICATION, CATARACT, WITH IOL INSERTION 10.61 01:00.1 (Right)  Electronically signed: Elsie Carmine 03/30/2024 8:55 AM

## 2024-03-30 NOTE — Anesthesia Postprocedure Evaluation (Signed)
 Anesthesia Post Note  Patient: Alejandro Stuart  Procedure(s) Performed: PHACOEMULSIFICATION, CATARACT, WITH IOL INSERTION 10.61 01:00.1 (Right: Eye)  Patient location during evaluation: PACU Anesthesia Type: MAC Level of consciousness: awake and alert, oriented and patient cooperative Pain management: pain level controlled Vital Signs Assessment: post-procedure vital signs reviewed and stable Respiratory status: spontaneous breathing, nonlabored ventilation and respiratory function stable Cardiovascular status: blood pressure returned to baseline and stable Postop Assessment: adequate PO intake Anesthetic complications: no   No notable events documented.   Last Vitals:  Vitals:   03/30/24 0900 03/30/24 0903  BP: 116/75   Pulse: (!) 57 (!) 57  Resp: 15 12  Temp:    SpO2: 97% 98%    Last Pain:  Vitals:   03/30/24 0858  PainSc: 0-No pain                 Alfonso Ruths

## 2024-03-30 NOTE — H&P (Signed)
 [facility]   Primary Care Physician:  Lenon Layman ORN, MD Ophthalmologist: Dr. Jaye  Pre-Procedure History & Physical: HPI:  Alejandro Stuart is a 78 y.o. male here for cataract surgery.   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 Authorizing Provider  amLODipine (NORVASC) 5 MG tablet Take 5 mg by mouth daily. 10/07/23  Yes [provider]  mirtazapine (REMERON) 7.5 MG tablet Take 7.5 mg by mouth at bedtime.   Yes [provider]  zolpidem (AMBIEN) 10 MG tablet Take 5 mg by mouth at bedtime. Take 1/2 tablet by mouth nightly   Yes [provider]  losartan (COZAAR) 50 MG tablet Take 25 mg by mouth daily.    [provider]  sildenafil  (VIAGRA ) 50 MG tablet Take 1-2 tablets (50-100 mg total) by mouth as needed. Take 1-2 tablets on empty stomach 30-45 minutes prior to sexual activity 08/19/23   Francisca Redell BROCKS, MD  triamcinolone  cream (KENALOG ) 0.1 % Apply 1 Application topically 2 (two) times daily as needed. Avoid applying to face, groin, and axilla. Use as directed. Long-term use can cause thinning of the skin. Patient not taking: Reported on 03/30/2024 08/27/23   Claudene Lehmann, MD    Allergies as of 03/09/2024 - Review Complete 12/18/2023  Allergen Reaction Noted   Other Other (See Comments) 11/06/2022   Statins  11/29/2021    Family History  Problem Relation Age of Onset   Stroke Father    Prostate cancer Father     Social History   Socioeconomic History   Marital status:  Married    Spouse name: Not on file   Number of children: 7   Years of education: Not on file   Highest education level: Bachelor's degree (e.g., BA, AB, BS)  Occupational History   Not on file  Tobacco Use   Smoking status: Former    Current packs/day: 0.00    Types: Cigarettes    Quit date: 09/16/1986    Years since quitting: 37.5    Passive exposure: Past   Smokeless tobacco: Never  Vaping Use   Vaping status: Never Used  Substance and Sexual Activity   Alcohol use: Yes    Alcohol/week: 7.0 standard drinks of alcohol    Types: 7 Shots of liquor per week   Drug use: Never   Sexual activity: Yes    Birth control/protection: None  Other Topics Concern   Not on file  Social History Narrative   Not on file   Social Drivers of Health   Financial Resource Strain: Low Risk  (01/08/2023)   Received from [facility]   Overall Financial Resource Strain (CARDIA)    Difficulty of Paying Living Expenses: Not hard at all  Food Insecurity: No Food Insecurity (09/01/2023)   Hunger Vital Sign    Worried About Running Out of Food in the Last Year: Never true    Ran Out of Food in the Last Year:  Never true  Transportation Needs: No Transportation Needs (01/08/2023)   Received from [facility] - Transportation    In the past 12 months, has lack of transportation kept you from medical appointments or from getting medications?: No    Lack of Transportation (Non-Medical): No  Physical Activity: Not on file  Stress: Not on file  Social Connections: Not on file  Intimate Partner Violence: Not At Risk (09/01/2023)   Humiliation, Afraid, Rape, and Kick questionnaire    Fear of Current or Ex-Partner: No    Emotionally Abused: No    Physically Abused: No    Sexually Abused: No    Review of Systems: See HPI, otherwise negative ROS  Physical Exam: BP (!) 187/80   Pulse (!) 44   Temp 97.6 F (36.4 C)   Resp 18   Ht 6' (1.829 m)   Wt 90.7 kg    SpO2 98%   BMI 27.12 kg/m  General:   Alert, cooperative. Head:  Normocephalic and atraumatic. Respiratory:  Normal work of breathing. Cardiovascular:  NAD  Impression/Plan: Alejandro Stuart is here for cataract surgery.  Risks, benefits, limitations, and alternatives regarding cataract surgery have been reviewed with the patient.  Questions have been answered.  All parties agreeable.   Elsie Carmine, MD  03/30/2024, 8:27 AM

## 2024-03-30 NOTE — Transfer of Care (Signed)
 Immediate Anesthesia Transfer of Care Note  Patient: Alejandro Stuart  Procedure(s) Performed: PHACOEMULSIFICATION, CATARACT, WITH IOL INSERTION 10.61 01:00.1 (Right: Eye)  Patient Location: PACU and Short Stay  Anesthesia Type:MAC  Level of Consciousness: awake  Airway & Oxygen Therapy: Patient Spontanous Breathing  Post-op Assessment: Report given to RN and Post -op Vital signs reviewed and stable  Post vital signs: Reviewed and stable  Last Vitals:  Vitals Value Taken Time  BP 116/75 03/30/24 09:00  Temp    Pulse 57 03/30/24 09:02  Resp 12 03/30/24 09:02  SpO2 98 % 03/30/24 09:02  Vitals shown include unfiled device data.  Last Pain:  Vitals:   03/30/24 0858  PainSc: 0-No pain         Complications: No notable events documented.

## 2024-03-31 ENCOUNTER — Encounter: Payer: Self-pay | Admitting: Ophthalmology

## 2024-03-31 ENCOUNTER — Ambulatory Visit

## 2024-03-31 NOTE — Anesthesia Preprocedure Evaluation (Addendum)
 Anesthesia Evaluation  Patient identified by MRN, date of birth, ID band Patient awake    Reviewed: Allergy & Precautions, H&P , NPO status , Patient's Chart, lab work & pertinent test results  History of Anesthesia Complications (+) history of anesthetic complications  Airway Mallampati: IV  TM Distance: >3 FB Neck ROM: Full    Dental no notable dental hx. (+) Missing, Chipped   Pulmonary neg pulmonary ROS, sleep apnea , former smoker   Pulmonary exam normal breath sounds clear to auscultation       Cardiovascular hypertension, negative cardio ROS Normal cardiovascular exam Rhythm:Regular Rate:Normal     Neuro/Psych  PSYCHIATRIC DISORDERS Anxiety      Neuromuscular disease negative neurological ROS  negative psych ROS   GI/Hepatic negative GI ROS, Neg liver ROS,,,  Endo/Other  negative endocrine ROS    Renal/GU Renal diseasenegative Renal ROS  negative genitourinary   Musculoskeletal negative musculoskeletal ROS (+) Arthritis ,    Abdominal   Peds negative pediatric ROS (+)  Hematology negative hematology ROS (+)   Anesthesia Other Findings Previous cataract surgery 02-20-14-25 Dr. Shellie  Prostate cancer [facility])  Insomnia Sinus bradycardia  Collagenous colitis Hypertension SCC (squamous cell carcinoma) Complication of anesthesia is urinary retention     Reproductive/Obstetrics negative OB ROS                              Anesthesia Physical Anesthesia Plan  ASA: 2  Anesthesia Plan: MAC   Post-op Pain Management:    Induction: Intravenous  PONV Risk Score and Plan:   Airway Management Planned: Natural Airway and Nasal Cannula  Additional Equipment:   Intra-op Plan:   Post-operative Plan:   Informed Consent: I have reviewed the patients History and Physical, chart, labs and discussed the procedure including the risks, benefits and alternatives for the proposed  anesthesia with the patient or authorized representative who has indicated his/her understanding and acceptance.     Dental Advisory Given  Plan Discussed with: Anesthesiologist, CRNA and Surgeon  Anesthesia Plan Comments: (Patient consented for risks of anesthesia including but not limited to:  - adverse reactions to medications - damage to eyes, teeth, lips or other oral mucosa - nerve damage due to positioning  - sore throat or hoarseness - Damage to heart, brain, nerves, lungs, other parts of body or loss of life  Patient voiced understanding and assent.)         Anesthesia Quick Evaluation

## 2024-04-01 ENCOUNTER — Ambulatory Visit

## 2024-04-01 ENCOUNTER — Inpatient Hospital Stay

## 2024-04-02 ENCOUNTER — Ambulatory Visit

## 2024-04-05 NOTE — Discharge Instructions (Signed)

## 2024-04-06 ENCOUNTER — Ambulatory Visit
Admission: RE | Admit: 2024-04-06 | Discharge: 2024-04-06 | Disposition: A | Discharge status: Home / Self Care | Attending: Ophthalmology | Admitting: Ophthalmology

## 2024-04-06 ENCOUNTER — Other Ambulatory Visit: Payer: Self-pay

## 2024-04-06 ENCOUNTER — Encounter: Payer: Self-pay | Admitting: Ophthalmology

## 2024-04-06 ENCOUNTER — Ambulatory Visit: Payer: Self-pay | Admitting: Anesthesiology

## 2024-04-06 ENCOUNTER — Encounter
Admission: RE | Disposition: A | Discharge status: Home / Self Care | Payer: Self-pay | Source: Home / Self Care | Attending: Ophthalmology

## 2024-04-06 DIAGNOSIS — G4730 Sleep apnea, unspecified: Secondary | ICD-10-CM | POA: Diagnosis not present

## 2024-04-06 DIAGNOSIS — I10 Essential (primary) hypertension: Secondary | ICD-10-CM | POA: Insufficient documentation

## 2024-04-06 DIAGNOSIS — H2512 Age-related nuclear cataract, left eye: Secondary | ICD-10-CM | POA: Insufficient documentation

## 2024-04-06 DIAGNOSIS — Z79899 Other long term (current) drug therapy: Secondary | ICD-10-CM | POA: Diagnosis not present

## 2024-04-06 DIAGNOSIS — Z87891 Personal history of nicotine dependence: Secondary | ICD-10-CM | POA: Insufficient documentation

## 2024-04-06 DIAGNOSIS — M1990 Unspecified osteoarthritis, unspecified site: Secondary | ICD-10-CM | POA: Diagnosis not present

## 2024-04-06 DIAGNOSIS — F419 Anxiety disorder, unspecified: Secondary | ICD-10-CM | POA: Insufficient documentation

## 2024-04-06 HISTORY — PX: CATARACT EXTRACTION W/PHACO: SHX586

## 2024-04-06 SURGERY — PHACOEMULSIFICATION, CATARACT, WITH IOL INSERTION
Anesthesia: Monitor Anesthesia Care | Site: Eye | Laterality: Left

## 2024-04-06 MED ORDER — GLYCOPYRROLATE 0.2 MG/ML IJ SOLN
INTRAMUSCULAR | Status: DC | PRN
  Administered 2024-04-06: .1 mg via INTRAVENOUS

## 2024-04-06 MED ORDER — MIDAZOLAM HCL 2 MG/2ML IJ SOLN
INTRAMUSCULAR | Status: DC | PRN
  Administered 2024-04-06 (×2): 1 mg via INTRAVENOUS

## 2024-04-06 MED ORDER — TETRACAINE HCL 0.5 % OP SOLN
1.0000 [drp] | OPHTHALMIC | Status: DC | PRN
  Administered 2024-04-06 (×3): 1 [drp] via OPHTHALMIC

## 2024-04-06 MED ORDER — MOXIFLOXACIN HCL 0.5 % OP SOLN
OPHTHALMIC | Status: DC | PRN
  Administered 2024-04-06: .2 mL via OPHTHALMIC

## 2024-04-06 MED ORDER — TETRACAINE HCL 0.5 % OP SOLN
OPHTHALMIC | Status: AC
  Filled 2024-04-06: qty 4

## 2024-04-06 MED ORDER — FENTANYL CITRATE (PF) 100 MCG/2ML IJ SOLN
INTRAMUSCULAR | Status: DC | PRN
  Administered 2024-04-06 (×2): 50 ug via INTRAVENOUS

## 2024-04-06 MED ORDER — ARMC OPHTHALMIC DILATING DROPS
OPHTHALMIC | Status: AC
Start: 2024-04-06 — End: 2024-04-06
  Filled 2024-04-06: qty 0.5

## 2024-04-06 MED ORDER — SIGHTPATH DOSE#1 NA CHONDROIT SULF-NA HYALURON 40-17 MG/ML IO SOLN
INTRAOCULAR | Status: DC | PRN
  Administered 2024-04-06: 1 mL via INTRAOCULAR

## 2024-04-06 MED ORDER — SIGHTPATH DOSE#1 BSS IO SOLN
INTRAOCULAR | Status: DC | PRN
  Administered 2024-04-06: 15 mL via INTRAOCULAR

## 2024-04-06 MED ORDER — LIDOCAINE HCL (PF) 2 % IJ SOLN
INTRAOCULAR | Status: DC | PRN
  Administered 2024-04-06: 2 mL

## 2024-04-06 MED ORDER — LACTATED RINGERS IV SOLN: INTRAVENOUS | Status: DC

## 2024-04-06 MED ORDER — GLYCOPYRROLATE 0.2 MG/ML IJ SOLN
INTRAMUSCULAR | Status: AC
  Filled 2024-04-06: qty 1

## 2024-04-06 MED ORDER — ARMC OPHTHALMIC DILATING DROPS
1.0000 | OPHTHALMIC | Status: DC | PRN
  Administered 2024-04-06 (×3): 1 via OPHTHALMIC

## 2024-04-06 MED ORDER — FENTANYL CITRATE (PF) 100 MCG/2ML IJ SOLN
INTRAMUSCULAR | Status: AC
Start: 2024-04-06 — End: 2024-04-06
  Filled 2024-04-06: qty 2

## 2024-04-06 MED ORDER — MIDAZOLAM HCL 2 MG/2ML IJ SOLN
INTRAMUSCULAR | Status: AC
Start: 2024-04-06 — End: 2024-04-06
  Filled 2024-04-06: qty 2

## 2024-04-06 MED ORDER — SIGHTPATH DOSE#1 BSS IO SOLN
INTRAOCULAR | Status: DC | PRN
  Administered 2024-04-06: 71 mL via OPHTHALMIC

## 2024-04-06 MED ORDER — BRIMONIDINE TARTRATE-TIMOLOL 0.2-0.5 % OP SOLN
OPHTHALMIC | Status: DC | PRN
  Administered 2024-04-06: 1 [drp] via OPHTHALMIC

## 2024-04-06 SURGICAL SUPPLY — 10 items
CATARACT SUITE SIGHTPATH (MISCELLANEOUS) ×1 IMPLANT
CYSTOTOME ANGL RVRS SHRT 25G (CUTTER) ×1 IMPLANT
FEE CATARACT SUITE SIGHTPATH (MISCELLANEOUS) ×1 IMPLANT
GLOVE BIOGEL PI IND STRL 8 (GLOVE) ×1 IMPLANT
GLOVE SURG LX STRL 8.0 MICRO (GLOVE) ×1 IMPLANT
GLOVE SURG SYN 6.5 PF PI BL (GLOVE) ×1 IMPLANT
LENS IOL TECNIS EYHANCE 17.5 (Intraocular Lens) IMPLANT
NDL FILTER BLUNT 18X1 1/2 (NEEDLE) ×1 IMPLANT
NEEDLE FILTER BLUNT 18X1 1/2 (NEEDLE) ×1 IMPLANT
SYR 3ML LL SCALE MARK (SYRINGE) ×1 IMPLANT

## 2024-04-06 NOTE — Anesthesia Postprocedure Evaluation (Signed)
 Anesthesia Post Note  Patient: Alejandro Stuart  Procedure(s) Performed: PHACOEMULSIFICATION, CATARACT, WITH IOL INSERTION 8.17 00:57.8 (Left: Eye)  Patient location during evaluation: PACU Anesthesia Type: MAC Level of consciousness: awake and alert Pain management: pain level controlled Vital Signs Assessment: post-procedure vital signs reviewed and stable Respiratory status: spontaneous breathing, nonlabored ventilation, respiratory function stable and patient connected to nasal cannula oxygen Cardiovascular status: stable and blood pressure returned to baseline Postop Assessment: no apparent nausea or vomiting Anesthetic complications: no   No notable events documented.   Last Vitals:  Vitals:   04/06/24 0830 04/06/24 0836  BP: 118/66 116/71  Pulse: (!) 45 (!) 54  Resp: 18 13  Temp: (!) 36.2 C 36.7 C  SpO2: 100% 95%    Last Pain:  Vitals:   04/06/24 0836  TempSrc:   PainSc: 0-No pain                 Donny JAYSON Mu

## 2024-04-06 NOTE — Op Note (Signed)
 PREOPERATIVE DIAGNOSIS:  Nuclear sclerotic cataract of the left eye.   POSTOPERATIVE DIAGNOSIS:  Nuclear sclerotic cataract of the left eye.   OPERATIVE PROCEDURE:ORPROCALL@   SURGEON:  Elsie Carmine, MD.   ANESTHESIA:  Anesthesiologist: Ola Donny BROCKS, MD CRNA: Jahoo, Sonia, CRNA  1.      Managed anesthesia care. 2.     0.15ml of Shugarcaine was instilled following the paracentesis   COMPLICATIONS:  None.   TECHNIQUE:   Stop and chop   DESCRIPTION OF PROCEDURE:  The patient was examined and consented in the preoperative holding area where the aforementioned topical anesthesia was applied to the left eye and then brought back to the Operating Room where the left eye was prepped and draped in the usual sterile ophthalmic fashion and a lid speculum was placed. A paracentesis was created with the side port blade and the anterior chamber was filled with viscoelastic. A near clear corneal incision was performed with the steel keratome. A continuous curvilinear capsulorrhexis was performed with a cystotome followed by the capsulorrhexis forceps. Hydrodissection and hydrodelineation were carried out with BSS on a blunt cannula. The lens was removed in a stop and chop  technique and the remaining cortical material was removed with the irrigation-aspiration handpiece. The capsular bag was inflated with viscoelastic and the Technis [id number] lens was placed in the capsular bag without complication. The remaining viscoelastic was removed from the eye with the irrigation-aspiration handpiece. The wounds were hydrated. The anterior chamber was flushed with BSS and the eye was inflated to physiologic pressure. 0.82ml Vigamox  was placed in the anterior chamber. The wounds were found to be water tight. The eye was dressed with Combigan . The patient was given protective glasses to wear throughout the day and a shield with which to sleep tonight. The patient was also given drops with which to begin a drop regimen  today and will follow-up with me in one day. Implant Name Type Inv. Item Serial No. Manufacturer Lot No. LRB No. Used Action  LENS IOL TECNIS EYHANCE 17.5 - [id number] Intraocular Lens LENS IOL TECNIS EYHANCE 17.5 [id number] S[employer]  Left 1 Implanted    Procedure(s): PHACOEMULSIFICATION, CATARACT, WITH IOL INSERTION 8.17 00:57.8 (Left)  Electronically signed: Elsie Carmine 04/06/2024 8:29 AM

## 2024-04-06 NOTE — Transfer of Care (Signed)
 Immediate Anesthesia Transfer of Care Note  Patient: Alejandro Stuart  Procedure(s) Performed: PHACOEMULSIFICATION, CATARACT, WITH IOL INSERTION 8.17 00:57.8 (Left: Eye)  Patient Location: PACU  Anesthesia Type: MAC  Level of Consciousness: awake, alert  and patient cooperative  Airway and Oxygen Therapy: Patient Spontanous Breathing and Patient connected to supplemental oxygen  Post-op Assessment: Post-op Vital signs reviewed, Patient's Cardiovascular Status Stable, Respiratory Function Stable, Patent Airway and No signs of Nausea or vomiting  Post-op Vital Signs: Reviewed and stable  Complications: No notable events documented.

## 2024-04-06 NOTE — H&P (Signed)
 [facility]   Primary Care Physician:  Lenon Layman ORN, MD Ophthalmologist: Dr. Jaye  Pre-Procedure History & Physical: HPI:  Alejandro Stuart is a 78 y.o. male here for cataract surgery.   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 Authorizing Provider  amLODipine (NORVASC) 5 MG tablet Take 5 mg by mouth daily. 10/07/23  Yes [provider]  losartan (COZAAR) 50 MG tablet Take 25 mg by mouth daily.   Yes [provider]  mirtazapine (REMERON) 7.5 MG tablet Take 7.5 mg by mouth at bedtime.   Yes [provider]  zolpidem (AMBIEN) 10 MG tablet Take 5 mg by mouth at bedtime. Take 1/2 tablet by mouth nightly   Yes [provider]  sildenafil  (VIAGRA ) 50 MG tablet Take 1-2 tablets (50-100 mg total) by mouth as needed. Take 1-2 tablets on empty stomach 30-45 minutes prior to sexual activity 08/19/23   Francisca Redell BROCKS, MD  triamcinolone  cream (KENALOG ) 0.1 % Apply 1 Application topically 2 (two) times daily as needed. Avoid applying to face, groin, and axilla. Use as directed. Long-term use can cause thinning of the skin. Patient not taking: Reported on 03/30/2024 08/27/23   Claudene Lehmann, MD    Allergies as of 03/09/2024 - Review Complete 12/18/2023  Allergen Reaction Noted    Other Other (See Comments) 11/06/2022   Statins  11/29/2021    Family History  Problem Relation Age of Onset   Stroke Father    Prostate cancer Father     Social History   Socioeconomic History   Marital status: Married    Spouse name: Not on file   Number of children: 7   Years of education: Not on file   Highest education level: Bachelor's degree (e.g., BA, AB, BS)  Occupational History   Not on file  Tobacco Use   Smoking status: Former    Current packs/day: 0.00    Types: Cigarettes    Quit date: 09/16/1986    Years since quitting: 37.5    Passive exposure: Past   Smokeless tobacco: Never  Vaping Use   Vaping status: Never Used  Substance and Sexual Activity   Alcohol use: Yes    Alcohol/week: 7.0 standard drinks of alcohol    Types: 7 Shots of liquor per week   Drug use: Never   Sexual activity: Yes    Birth control/protection: None  Other Topics Concern   Not on file  Social History Narrative   Not on file   Social Drivers of Health   Financial Resource Strain: Low Risk  (01/08/2023)   Received from [facility]   Overall Financial Resource Strain (CARDIA)    Difficulty of Paying Living Expenses: Not hard at all  Food Insecurity: No Food Insecurity (  09/01/2023)   Hunger Vital Sign    Worried About Running Out of Food in the Last Year: Never true    Ran Out of Food in the Last Year: Never true  Transportation Needs: No Transportation Needs (01/08/2023)   Received from [facility] - Transportation    In the past 12 months, has lack of transportation kept you from medical appointments or from getting medications?: No    Lack of Transportation (Non-Medical): No  Physical Activity: Not on file  Stress: Not on file  Social Connections: Not on file  Intimate Partner Violence: Not At Risk (09/01/2023)   Humiliation, Afraid, Rape, and Kick questionnaire    Fear of Current or Ex-Partner: No    Emotionally Abused: No     Physically Abused: No    Sexually Abused: No    Review of Systems: See HPI, otherwise negative ROS  Physical Exam: BP (!) 173/96   Pulse (!) 49   Temp (!) 97.1 F (36.2 C) (Temporal)   Resp 14   Wt 88.5 kg   SpO2 96%   BMI 26.45 kg/m  General:   Alert, cooperative. Head:  Normocephalic and atraumatic. Respiratory:  Normal work of breathing. Cardiovascular:  NAD  Impression/Plan: Alejandro Stuart is here for cataract surgery.  Risks, benefits, limitations, and alternatives regarding cataract surgery have been reviewed with the patient.  Questions have been answered.  All parties agreeable.   Elsie Carmine, MD  04/06/2024, 8:06 AM

## 2024-04-15 ENCOUNTER — Ambulatory Visit
Admission: RE | Admit: 2024-04-15 | Discharge: 2024-04-15 | Discharge status: Home / Self Care | Source: Ambulatory Visit | Attending: Radiation Oncology | Admitting: Radiation Oncology

## 2024-04-15 ENCOUNTER — Encounter: Payer: Self-pay | Admitting: Radiation Oncology

## 2024-04-15 VITALS — BP 134/88 | HR 62 | Temp 96.8°F | Resp 15 | Ht 72.0 in | Wt 197.4 lb

## 2024-04-15 DIAGNOSIS — C61 Malignant neoplasm of prostate: Secondary | ICD-10-CM

## 2024-04-15 NOTE — Progress Notes (Signed)
 Radiation Oncology Follow up Note  Name: Alejandro Stuart   Date:   04/15/2024 MRN:  [id number] DOB: [birth year]    This 78 y.o. male presents to the clinic today for 1 month follow-up status post image guided radiation therapy for Gleason 7 (4+3) adenocarcinoma the prostate.  REFERRING PROVIDER: Lenon Layman ORN, MD  HPI: Patient is a 78 year old male now out 1 month having completed image guided IMRT radiation therapy to his prostate.  Patient also received one 61-month depot of Eligard .  Seen today in routine follow-up he is doing well specifically denies any significant increase in lower urinary tract symptoms diarrhea or fatigue.  He states the Eligard  has made him somewhat uncomfortable..  COMPLICATIONS OF TREATMENT: none  FOLLOW UP COMPLIANCE: keeps appointments   PHYSICAL EXAM:  BP 134/88   Pulse 62   Temp (!) 96.8 F (36 C) (Tympanic)   Resp 15   Ht 6' (1.829 m)   Wt 197 lb 6.4 oz (89.5 kg)   BMI 26.77 kg/m  Well-developed well-nourished patient in NAD. HEENT reveals PERLA, EOMI, discs not visualized.  Oral cavity is clear. No oral mucosal lesions are identified. Neck is clear without evidence of cervical or supraclavicular adenopathy. Lungs are clear to A&P. Cardiac examination is essentially unremarkable with regular rate and rhythm without murmur rub or thrill. Abdomen is benign with no organomegaly or masses noted. Motor sensory and DTR levels are equal and symmetric in the upper and lower extremities. Cranial nerves II through XII are grossly intact. Proprioception is intact. No peripheral adenopathy or edema is identified. No motor or sensory levels are noted. Crude visual fields are within normal range.  RADIOLOGY RESULTS: No current films to review  PLAN: The present time patient is doing well low side effect profile from his external beam radiation therapy.  And pleased with his overall progress have asked to see him back in 3 months for follow-up with repeat PSA  prior to that visit.  Patient knows to call with any concerns.  I would like to take this opportunity to thank you for allowing me to participate in the care of your patient.SABRA [name], MD

## 2024-04-22 ENCOUNTER — Ambulatory Visit: Admitting: Urology

## 2024-04-28 ENCOUNTER — Ambulatory Visit: Admitting: Urology

## 2024-04-28 VITALS — BP 165/79 | HR 63 | Ht 72.0 in | Wt 191.2 lb

## 2024-04-28 DIAGNOSIS — C61 Malignant neoplasm of prostate: Secondary | ICD-10-CM

## 2024-04-28 NOTE — Patient Instructions (Signed)

## 2024-04-28 NOTE — Progress Notes (Signed)
   04/28/2024 10:38 AM   Gilmore Sit [birth year] [id number]  Reason for visit: Prostate cancer  HPI: 78 year old male who moved to the area in April 2023 after being on active surveillance for low risk prostate cancer in [place] .  He had increase in his PSA to 12 prompting a prostate MRI, this showed a PI-RADS 4 lesion in the right posterior lateral peripheral zone and left posterior lateral peripheral zone.  He underwent fusion biopsy with Dr. Penne in November 2024 showing unfavorable intermediate risk disease in ROI #1.  He ultimately underwent external beam radiation that was completed in early July 2025, and a 68-month injection of Lupron  12/18/2023.  He had bother some side effects from the ADT with low libido, ED, fatigue.  He denies any significant symptoms from radiation.  He has baseline nocturia 3 times at night.  He also has a history of urinary retention after a hip surgery, and was on catheterization and Flomax , had bothersome side effects from Flomax  and ultimately discontinued that medication.  Denies any trouble emptying at this time.  PSA today is pending, call with results RTC 6 months PSA prior(has 3 mo follow up with Dr Lenn)  Redell JAYSON Burnet, MD  [facility] [address], [address] [place], [place] [address] [phone]

## 2024-04-29 ENCOUNTER — Ambulatory Visit: Payer: Self-pay | Admitting: Urology

## 2024-04-29 LAB — PSA: Prostate Specific Ag, Serum: <0.1 ng/mL (ref 0.0–4.0)

## 2024-05-27 ENCOUNTER — Telehealth: Payer: Self-pay

## 2024-05-27 NOTE — Telephone Encounter (Signed)
 Updating Mohs, history/specimen tracking.

## 2024-06-21 DIAGNOSIS — Z87898 Personal history of other specified conditions: Secondary | ICD-10-CM

## 2024-06-22 MED ORDER — TAMSULOSIN HCL 0.4 MG PO CAPS: 0.4000 mg | ORAL_CAPSULE | Freq: Every day | ORAL | 3 refills | Status: AC

## 2024-06-22 NOTE — Telephone Encounter (Signed)
 Patient with hx of post op urinary retention, rx for flomax  sent.

## 2024-07-06 ENCOUNTER — Other Ambulatory Visit: Payer: Self-pay | Admitting: Orthopedic Surgery

## 2024-07-06 ENCOUNTER — Ambulatory Visit
Admission: RE | Admit: 2024-07-06 | Discharge: 2024-07-06 | Disposition: A | Discharge status: Home / Self Care | Source: Ambulatory Visit | Attending: Orthopedic Surgery | Admitting: Orthopedic Surgery

## 2024-07-06 ENCOUNTER — Other Ambulatory Visit: Payer: Self-pay | Admitting: *Deleted

## 2024-07-06 DIAGNOSIS — R2241 Localized swelling, mass and lump, right lower limb: Secondary | ICD-10-CM

## 2024-07-06 DIAGNOSIS — C61 Malignant neoplasm of prostate: Secondary | ICD-10-CM

## 2024-07-15 ENCOUNTER — Ambulatory Visit: Admitting: Radiation Oncology

## 2024-07-15 ENCOUNTER — Emergency Department
Admission: EM | Admit: 2024-07-15 | Discharge: 2024-07-15 | Disposition: A | Discharge status: Home / Self Care | Attending: Emergency Medicine | Admitting: Emergency Medicine

## 2024-07-15 ENCOUNTER — Inpatient Hospital Stay: Attending: Radiation Oncology

## 2024-07-15 ENCOUNTER — Emergency Department

## 2024-07-15 ENCOUNTER — Other Ambulatory Visit: Payer: Self-pay

## 2024-07-15 ENCOUNTER — Encounter: Payer: Self-pay | Admitting: *Deleted

## 2024-07-15 DIAGNOSIS — Z8546 Personal history of malignant neoplasm of prostate: Secondary | ICD-10-CM | POA: Insufficient documentation

## 2024-07-15 DIAGNOSIS — Z96659 Presence of unspecified artificial knee joint: Secondary | ICD-10-CM | POA: Diagnosis not present

## 2024-07-15 DIAGNOSIS — I2510 Atherosclerotic heart disease of native coronary artery without angina pectoris: Secondary | ICD-10-CM | POA: Diagnosis not present

## 2024-07-15 DIAGNOSIS — R42 Dizziness and giddiness: Secondary | ICD-10-CM | POA: Diagnosis present

## 2024-07-15 DIAGNOSIS — R55 Syncope and collapse: Secondary | ICD-10-CM | POA: Diagnosis not present

## 2024-07-15 DIAGNOSIS — D649 Anemia, unspecified: Secondary | ICD-10-CM | POA: Insufficient documentation

## 2024-07-15 DIAGNOSIS — C61 Malignant neoplasm of prostate: Secondary | ICD-10-CM

## 2024-07-15 LAB — COMPREHENSIVE METABOLIC PANEL WITH GFR
ALT: 17 U/L (ref 0–44)
AST: 23 U/L (ref 15–41)
Albumin: 3.4 g/dL — ABNORMAL LOW (ref 3.5–5.0)
Alkaline Phosphatase: 236 U/L — ABNORMAL HIGH (ref 38–126)
Anion gap: 12 (ref 5–15)
BUN: 28 mg/dL — ABNORMAL HIGH (ref 8–23)
CO2: 20 mmol/L — ABNORMAL LOW (ref 22–32)
Calcium: 9.1 mg/dL (ref 8.9–10.3)
Chloride: 101 mmol/L (ref 98–111)
Creatinine, Ser: 1.82 mg/dL — ABNORMAL HIGH (ref 0.61–1.24)
GFR, Estimated: 38 mL/min — ABNORMAL LOW (ref >60)
Glucose, Bld: 165 mg/dL — ABNORMAL HIGH (ref 70–99)
Potassium: 4.6 mmol/L (ref 3.5–5.1)
Sodium: 133 mmol/L — ABNORMAL LOW (ref 135–145)
Total Bilirubin: 1.2 mg/dL (ref 0.0–1.2)
Total Protein: 6.7 g/dL (ref 6.5–8.1)

## 2024-07-15 LAB — TROPONIN I (HIGH SENSITIVITY)
Troponin I (High Sensitivity): 11 ng/L (ref <18)
Troponin I (High Sensitivity): 10 ng/L (ref <18)

## 2024-07-15 LAB — CBC (CANCER CENTER ONLY)
HCT: 29.9 % — ABNORMAL LOW (ref 39.0–52.0)
Hemoglobin: 9.8 g/dL — ABNORMAL LOW (ref 13.0–17.0)
MCH: 31.1 pg (ref 26.0–34.0)
MCHC: 32.8 g/dL (ref 30.0–36.0)
MCV: 94.9 fL (ref 80.0–100.0)
Platelet Count: 314 K/uL (ref 150–400)
RBC: 3.15 MIL/uL — ABNORMAL LOW (ref 4.22–5.81)
RDW: 12.7 % (ref 11.5–15.5)
WBC Count: 9.5 K/uL (ref 4.0–10.5)
nRBC: 0.3 % — ABNORMAL HIGH (ref 0.0–0.2)

## 2024-07-15 LAB — CBC
HCT: 26 % — ABNORMAL LOW (ref 39.0–52.0)
Hemoglobin: 8.4 g/dL — ABNORMAL LOW (ref 13.0–17.0)
MCH: 30.8 pg (ref 26.0–34.0)
MCHC: 32.3 g/dL (ref 30.0–36.0)
MCV: 95.2 fL (ref 80.0–100.0)
Platelets: 262 K/uL (ref 150–400)
RBC: 2.73 MIL/uL — ABNORMAL LOW (ref 4.22–5.81)
RDW: 12.7 % (ref 11.5–15.5)
WBC: 6.7 K/uL (ref 4.0–10.5)
nRBC: 0 % (ref 0.0–0.2)

## 2024-07-15 LAB — PROTIME-INR
INR: 1.1 (ref 0.8–1.2)
Prothrombin Time: 14.8 s (ref 11.4–15.2)

## 2024-07-15 LAB — PSA: Prostatic Specific Antigen: <0.02 ng/mL (ref 0.00–4.00)

## 2024-07-15 MED ORDER — SODIUM CHLORIDE 0.9 % IV BOLUS
1000.0000 mL | Freq: Once | INTRAVENOUS | Status: AC
  Administered 2024-07-15: 1000 mL via INTRAVENOUS

## 2024-07-15 MED ORDER — IOHEXOL 350 MG/ML SOLN
75.0000 mL | Freq: Once | INTRAVENOUS | Status: AC | PRN
Start: 2024-07-15 — End: 2024-07-15
  Administered 2024-07-15: 75 mL via INTRAVENOUS

## 2024-07-15 NOTE — ED Notes (Signed)
 Md at bedside.  Iv and labs sent.

## 2024-07-15 NOTE — ED Triage Notes (Signed)
 Pt brought in via ems from [facility] with hypotensive and dizziness.  Pt had right knee replacement 2 weeks ago.  Pt was doing rehab and became lightheaded and dizzy.  Pt alert on arrival  pt pale.

## 2024-07-15 NOTE — ED Notes (Addendum)
 Pt denies chest pain or sob.  Pt ate only a banana today.  No n/v/d  pt last etoh drank was last night.    Good pedal pulses in both feet per dr ernest.  Sinus brady on monitor.    Pt alert  speech clear

## 2024-07-15 NOTE — ED Notes (Signed)
 Ambulated patient with a walker. Pt steady on feet but winded while walking. Once back to the room prt started to feel a little dizzy.

## 2024-07-15 NOTE — Discharge Instructions (Signed)
 We discussed the recommendation for oncology to start a blood thinner but you have opted to want to hold off at this time.  He should follow-up for repeat ultrasound next week and return to the ER if you develop chest pain, shortness of breath, swelling your leg or any other concerns.  Continue taking your baby aspirin daily.  I have also placed a referral for cardiology given you have some coronary calcifications noted  MPRESSION: 1. Linear echogenic material within the mid right femoral vein which is incompletely compressible and there is luminal narrowing. The overall appearance favors chronic dvt, however, this is not clearly visualized on the ultrasound performed 9 days prior The deep venous system of the right lower extremity is otherwise patent and without evidence for thrombus. 2. Negative for left lower extremity acute dvt.

## 2024-07-15 NOTE — ED Provider Notes (Signed)
 [facility] Provider Note    Event Date/Time   First MD Initiated Contact with Patient 07/15/24 1616     (approximate)   History   hypotensive and Dizziness   HPI  Alejandro Stuart is a 78 y.o. male with history of knee replacement 2 weeks ago who comes in with feeling lightheaded and dizzy.  Patient reports that he was on oxycodone 10 mg and yet yesterday they switched him to Dilaudid.  He reports taking it for the first time today and this was his third dose that he took around 2 PM.  He then went to go to PT and when he went to go stand up he felt lightheaded and dizzy and had near syncopal episode did not hit his head did not have any chest pain shortness of breath or abdominal pain.  Patient reports that his baseline heart rates are in the 40s that he is had this since he was a kid and he is not on any heart rate reducers.  He denies any shortness of breath.  He denies any concerns for DVTs and has been taking a baby aspirin.  He did have ultrasounds done of the leg on 10/21 without evidence of any DVT but did show some concerns for nonocclusive thrombus consistent with superficial thrombophlebitis  Physical Exam   Triage Vital Signs: ED Triage Vitals  Encounter Vitals Group     BP 07/15/24 1618 (!) 123/51     Girls Systolic BP Percentile --      Girls Diastolic BP Percentile --      Boys Systolic BP Percentile --      Boys Diastolic BP Percentile --      Pulse Rate 07/15/24 1618 (!) 45     Resp 07/15/24 1618 20     Temp 07/15/24 1618 98 F (36.7 C)     Temp Source 07/15/24 1618 Oral     SpO2 07/15/24 1618 94 %     Weight 07/15/24 1615 190 lb (86.2 kg)     Height 07/15/24 1615 6' (1.829 m)     Head Circumference --      Peak Flow --      Pain Score 07/15/24 1615 3     Pain Loc --      Pain Education --      Exclude from Growth Chart --     Most recent vital signs: Vitals:   07/15/24 1618  BP: (!) 123/51  Pulse: (!) 45  Resp: 20  Temp: 98  F (36.7 C)  SpO2: 94%     General: Awake, no distress.  CV:  Good peripheral perfusion. Bradycardiac  Resp:  Normal effort.  Abd:  No distention.  Soft and nontender Other:  No swelling legs.  No calf tenderness   ED Results / Procedures / Treatments   Labs (all labs ordered are listed, but only abnormal results are displayed) Labs Reviewed  CBC  PROTIME-INR  COMPREHENSIVE METABOLIC PANEL WITH GFR  TROPONIN I (HIGH SENSITIVITY)     EKG  My interpretation of EKG:  Sinus bradycardia with a rate of 40 without any ST elevation or T wave inversions, normal intervals  RADIOLOGY I have reviewed the xray personally and interpreted no PNA    PROCEDURES:  Critical Care performed: No  .1-3 Lead EKG Interpretation  Performed by: Ernest Ronal BRAVO, MD Authorized by: Ernest Ronal BRAVO, MD     Interpretation: abnormal     ECG rate:  30-40  ECG rate assessment: bradycardic     Rhythm: sinus bradycardia     Ectopy: none     Conduction: normal      MEDICATIONS ORDERED IN ED: Medications  sodium chloride 0.9 % bolus 1,000 mL (1,000 mLs Intravenous New Bag/Given 07/15/24 1653)  iohexol (OMNIPAQUE) 350 MG/ML injection 75 mL (75 mLs Intravenous Contrast Given 07/15/24 1904)     IMPRESSION / MDM / ASSESSMENT AND PLAN / ED COURSE  I reviewed the triage vital signs and the nursing notes.   Patient's presentation is most consistent with acute presentation with potential threat to life or bodily function.   Patient comes in with near syncopal episode.  Suspect this is related to being started on Dilaudid he does report feeling a little woozy.  Patient does report some decreased p.o. intake as well so we will check labs to evaluate for electrolyte abnormalities, AKI.  Will give patient some IV fluids and monitor patient on a cardiac monitor.  He does have bradycardia but he reports that this is baseline for him to run in the 30s to 40s that he has been seen by cardiology multiple times  that they do not feel like he needs to have a pacemaker and has been sinus in nature. I considered the possibility of pulmonary embolism given his recent surgery and I reviewed where he had ultrasound done that showed some superficial clot therefore I will repeat the ultrasound to ensure no deeper clot now.  Troponins are negative x 2.  CBC does show a lower hemoglobin of 8.4.  I did discuss this with patient and he does report that he is currently being treated for cancer and that since then his hemoglobin levels have gone down he denies any black stool.  His creatinine is 1.8 which does appear slightly worse than baseline of 1.4 and patient getting some IV fluids.  MPRESSION: 1. Linear echogenic material within the mid right femoral vein which is incompletely compressible and there is luminal narrowing. The overall appearance favors chronic dvt, however, this is not clearly visualized on the ultrasound performed 9 days prior The deep venous system of the right lower extremity is otherwise patent and without evidence for thrombus. 2. Negative for left lower extremity acute dvt.  Given this questionable blood clot I will get CT pulmonary to evaluate for any blood clots.  IMPRESSION: 1. No pulmonary embolism. 2. Bronchial wall thickening and scattered airway impaction, consistent with airway inflammation. 3. Extensive coronary artery calcifications. Calcification of the aortic valve leaflets.  Patient's cardiac markers are negative x 2 so I think that this calcification can be followed up outpatient he really denies any shortness of breath to suggest an active bronchitis.  Discussed with Dr Rao-and the low hemoglobin is not a reason not to do a blood thinner if we think we need to do it.  He has got no evidence of active bleeding his Hemoccult was negative and he is brown stool and sometimes the hemoglobin levels can just become low after surgery but does not actually mean that he is having  any active bleeding.  She did recommend that we discussed with the radiologist to see what they think if it looks like a blood clot that she would err on the side of caution and start a blood thinner.  Patient does report that he does drink alcohol occasionally but he denies any recurrent falls or bleeding issues that would put him at high risk for starting him on a blood thinner.  9:51 PM discussed with the radiologist he really was not sure what to make of this area and that it could just be that it was there previously and just not imaged last time but does not have a a new acute look as it has a little bit of calcification on it.  I discussed this with patient how her oncologist did recommend starting the blood thinner however patient states that he would prefer not to.  He has got no evidence of blood clot in his leg and he has no symptoms of DVT and this could just be incidental in nature he understands there is a risk for causing blood clots and this getting worse if there was a blood clot that can be life-threatening but at this time he is not interested in starting a blood thinner.  Is going to restart his iron that he was on previously for his low hemoglobin levels and he states that he will follow-up with his primary care doctor next week for repeat evaluation.  Did recommend a repeat ultrasound next week to evaluate this area that if he develops any chest pain, shortness of breath to return to the ER.  In the meantime he feels much improved without any syncope or near syncope with ambulation.  He suspect that this near syncopal episode was related to the Dilaudid and we discussed going back to his oxycodone and he expressed understanding felt comfortable with discharge home and was requesting to be discharged at this time  The patient is on the cardiac monitor to evaluate for evidence of arrhythmia and/or significant heart rate changes.      FINAL CLINICAL IMPRESSION(S) / ED DIAGNOSES   Final  diagnoses:  Coronary artery calcification  Near syncope  Anemia, unspecified type     Rx / DC Orders   ED Discharge Orders          Ordered    Ambulatory referral to Cardiology        07/15/24 2156    Ambulatory referral to Hematology / Oncology        07/15/24 2158             Note:  This document was prepared using Dragon voice recognition software and may include unintentional dictation errors.   Ernest Ronal BRAVO, MD 07/15/24 2158

## 2024-07-21 ENCOUNTER — Other Ambulatory Visit: Payer: Self-pay | Admitting: *Deleted

## 2024-07-21 ENCOUNTER — Ambulatory Visit
Admission: RE | Admit: 2024-07-21 | Discharge: 2024-07-21 | Disposition: A | Discharge status: Home / Self Care | Source: Ambulatory Visit | Attending: Radiation Oncology | Admitting: Radiation Oncology

## 2024-07-21 ENCOUNTER — Encounter: Payer: Self-pay | Admitting: Radiation Oncology

## 2024-07-21 VITALS — BP 133/76 | HR 72 | Temp 97.7°F | Resp 15 | Ht 72.0 in | Wt 190.0 lb

## 2024-07-21 DIAGNOSIS — R350 Frequency of micturition: Secondary | ICD-10-CM | POA: Insufficient documentation

## 2024-07-21 DIAGNOSIS — Z923 Personal history of irradiation: Secondary | ICD-10-CM | POA: Diagnosis not present

## 2024-07-21 DIAGNOSIS — C61 Malignant neoplasm of prostate: Secondary | ICD-10-CM | POA: Diagnosis present

## 2024-07-21 NOTE — Progress Notes (Signed)
 Radiation Oncology Follow up Note  Name: Alejandro Stuart   Date:   07/21/2024 MRN:  [id number] DOB: 04-15-1946    This 78 y.o. male presents to the clinic today for 86-month follow-up status post image guided IMRT radiation therapy for Gleason 7 (4+3) adenocarcinoma the prostate.  REFERRING PROVIDER: Lenon Layman ORN, MD  HPI: Patient is a 78 year old male now out 4 months having completed image guided IMRT radiation therapy for Gleason 7 (4+3) adenocarcinoma the prostate seen today in routine follow-up he is doing fairly well.  He does have some urgency and frequency of urination.  He is having no bowel complaints.  His PSA shows excellent response to treatment less than 0.02.SABRA  COMPLICATIONS OF TREATMENT: none  FOLLOW UP COMPLIANCE: keeps appointments   PHYSICAL EXAM:  BP 133/76   Pulse 72   Temp 97.7 F (36.5 C) (Tympanic)   Resp 15   Ht 6' (1.829 m)   Wt 190 lb (86.2 kg)   BMI 25.77 kg/m  Well-developed well-nourished patient in NAD. HEENT reveals PERLA, EOMI, discs not visualized.  Oral cavity is clear. No oral mucosal lesions are identified. Neck is clear without evidence of cervical or supraclavicular adenopathy. Lungs are clear to A&P. Cardiac examination is essentially unremarkable with regular rate and rhythm without murmur rub or thrill. Abdomen is benign with no organomegaly or masses noted. Motor sensory and DTR levels are equal and symmetric in the upper and lower extremities. Cranial nerves II through XII are grossly intact. Proprioception is intact. No peripheral adenopathy or edema is identified. No motor or sensory levels are noted. Crude visual fields are within normal range.  RADIOLOGY RESULTS: No current films for review  PLAN: Present time patient is having some issues with frequency of urination.  He will be seeing urology in the next several weeks which I have asked him to address at with them since they can better evaluate mediation of those symptoms.  I am  pleased with his overall progress and excellent response biochemically to treatment.  I have asked to see him back in 6 months with a repeat PSA at that time.  Patient knows to call with any concerns.  I would like to take this opportunity to thank you for allowing me to participate in the care of your patient.SABRA [name], MD

## 2024-07-22 ENCOUNTER — Inpatient Hospital Stay

## 2024-07-22 ENCOUNTER — Encounter: Payer: Self-pay | Admitting: Internal Medicine

## 2024-07-22 ENCOUNTER — Ambulatory Visit: Payer: Self-pay | Admitting: Internal Medicine

## 2024-07-22 ENCOUNTER — Inpatient Hospital Stay: Attending: Internal Medicine | Admitting: Internal Medicine

## 2024-07-22 VITALS — BP 121/79 | HR 68 | Temp 95.5°F | Resp 20 | Ht 72.0 in | Wt 182.0 lb

## 2024-07-22 DIAGNOSIS — Z7982 Long term (current) use of aspirin: Secondary | ICD-10-CM | POA: Diagnosis not present

## 2024-07-22 DIAGNOSIS — Z79899 Other long term (current) drug therapy: Secondary | ICD-10-CM | POA: Insufficient documentation

## 2024-07-22 DIAGNOSIS — D649 Anemia, unspecified: Secondary | ICD-10-CM | POA: Insufficient documentation

## 2024-07-22 DIAGNOSIS — Z923 Personal history of irradiation: Secondary | ICD-10-CM | POA: Insufficient documentation

## 2024-07-22 DIAGNOSIS — Z96659 Presence of unspecified artificial knee joint: Secondary | ICD-10-CM | POA: Insufficient documentation

## 2024-07-22 DIAGNOSIS — I10 Essential (primary) hypertension: Secondary | ICD-10-CM | POA: Insufficient documentation

## 2024-07-22 DIAGNOSIS — C61 Malignant neoplasm of prostate: Secondary | ICD-10-CM | POA: Insufficient documentation

## 2024-07-22 DIAGNOSIS — Z87891 Personal history of nicotine dependence: Secondary | ICD-10-CM | POA: Insufficient documentation

## 2024-07-22 LAB — COMPREHENSIVE METABOLIC PANEL WITH GFR
ALT: 22 U/L (ref 0–44)
AST: 27 U/L (ref 15–41)
Albumin: 4.4 g/dL (ref 3.5–5.0)
Alkaline Phosphatase: 291 U/L — ABNORMAL HIGH (ref 38–126)
Anion gap: 12 (ref 5–15)
BUN: 25 mg/dL — ABNORMAL HIGH (ref 8–23)
CO2: 22 mmol/L (ref 22–32)
Calcium: 10 mg/dL (ref 8.9–10.3)
Chloride: 102 mmol/L (ref 98–111)
Creatinine, Ser: 1.56 mg/dL — ABNORMAL HIGH (ref 0.61–1.24)
GFR, Estimated: 45 mL/min — ABNORMAL LOW (ref >60)
Glucose, Bld: 113 mg/dL — ABNORMAL HIGH (ref 70–99)
Potassium: 4.1 mmol/L (ref 3.5–5.1)
Sodium: 136 mmol/L (ref 135–145)
Total Bilirubin: 1.1 mg/dL (ref 0.0–1.2)
Total Protein: 8.7 g/dL — ABNORMAL HIGH (ref 6.5–8.1)

## 2024-07-22 LAB — ABO/RH: ABO/RH(D): O POS

## 2024-07-22 LAB — CBC WITH DIFFERENTIAL/PLATELET
Abs Immature Granulocytes: 0.04 K/uL (ref 0.00–0.07)
Basophils Absolute: 0.1 K/uL (ref 0.0–0.1)
Basophils Relative: 1 %
Eosinophils Absolute: 0.5 K/uL (ref 0.0–0.5)
Eosinophils Relative: 5 %
HCT: 34.8 % — ABNORMAL LOW (ref 39.0–52.0)
Hemoglobin: 11.4 g/dL — ABNORMAL LOW (ref 13.0–17.0)
Immature Granulocytes: 0 %
Lymphocytes Relative: 18 %
Lymphs Abs: 1.8 K/uL (ref 0.7–4.0)
MCH: 30.4 pg (ref 26.0–34.0)
MCHC: 32.8 g/dL (ref 30.0–36.0)
MCV: 92.8 fL (ref 80.0–100.0)
Monocytes Absolute: 1 K/uL (ref 0.1–1.0)
Monocytes Relative: 10 %
Neutro Abs: 6.8 K/uL (ref 1.7–7.7)
Neutrophils Relative %: 66 %
Platelets: 348 K/uL (ref 150–400)
RBC: 3.75 MIL/uL — ABNORMAL LOW (ref 4.22–5.81)
RDW: 12.4 % (ref 11.5–15.5)
WBC: 10.3 K/uL (ref 4.0–10.5)
nRBC: 0 % (ref 0.0–0.2)

## 2024-07-22 LAB — IRON AND TIBC
Iron: 40 ug/dL — ABNORMAL LOW (ref 45–182)
Saturation Ratios: 12 % — ABNORMAL LOW (ref 17.9–39.5)
TIBC: 328 ug/dL (ref 250–450)
UIBC: 288 ug/dL

## 2024-07-22 LAB — RETICULOCYTES
Immature Retic Fract: 7.9 % (ref 2.3–15.9)
RBC.: 3.73 MIL/uL — ABNORMAL LOW (ref 4.22–5.81)
Retic Count, Absolute: 47.7 K/uL (ref 19.0–186.0)
Retic Ct Pct: 1.3 % (ref 0.4–3.1)

## 2024-07-22 LAB — SAMPLE TO BLOOD BANK

## 2024-07-22 LAB — LACTATE DEHYDROGENASE: LDH: 183 U/L (ref 98–192)

## 2024-07-22 LAB — FOLATE: Folate: 14.9 ng/mL (ref >5.9)

## 2024-07-22 LAB — FERRITIN: Ferritin: 582 ng/mL — ABNORMAL HIGH (ref 24–336)

## 2024-07-22 LAB — C-REACTIVE PROTEIN: CRP: 6.9 mg/dL — ABNORMAL HIGH (ref <1.0)

## 2024-07-22 LAB — VITAMIN B12: Vitamin B-12: 1185 pg/mL — ABNORMAL HIGH (ref 180–914)

## 2024-07-22 NOTE — Assessment & Plan Note (Addendum)
#   Anemia-hemoglobin 8.3 [October 2025] GFR 38-s/p surgery 3 weeks prior.  However hemoglobin April-May 2025-around 12.  Baseline GFR-40s to 50s  # I had a long discussion with the patient regarding etiology of unexplained anemia.  The etiologies include -benign like nutritional/malabsorption, liver disease, chronic kidney disease viral infections, autoimmune; blood loss etc. is quite possible the recent surgery  # Given the borderline anemia-given prior to surgery-discussed regarding possible need for bone marrow biopsy-possible malignancy.  But recommend further blood workup before consideration of any bone marrow biopsy.  I recommend CBC CMP LDH haptoglobin B12 folic acid reticulocyte count platelet immature fraction; review of peripheral smear.  Iron studies; ferritin.myeloma panel kappa lambda light chain ratio.  Given the borderline low hemoglobin check ABO/Rh; hold tube.   # Chronic femoral DVT [ultrasound October 2025-]-in the context of recent right knee surgery -discussed the risk of DVT given Limited mobility at the same time ongoing anemia.  Hold off any blood thinners given the ongoing anemia-although no obvious bleeding noted.  Continue aspirin.  Discussed the ER triggers  # Chronic mild/rising Alkaline phos- Normal- LFTs otherwise- await repeat labs. Recent CT chest- OCT 2025- neg for bone lesions.   Thank you Dr Lenon MD  for allowing me to participate in the care of your pleasant patient. Please do not hesitate to contact me with questions or concerns in the interim.  # DISPOSITION: # labs today- ordered  # follow up TBD- Dr.B  # 45 minutes face-to-face with the patient discussing the above plan of care; more than 50% of time spent on counseling and coordination. My contact information was given; and all questions were answered. The patient knows to call the clinic with any problems, questions or concerns.

## 2024-07-22 NOTE — Progress Notes (Signed)
 [facility] CONSULT NOTE  Patient Care Team: Lenon Layman ORN, MD as PCP - General (Internal Medicine) Lenn Aran, MD as Consulting Physician (Radiation Oncology) Rennie Cindy SAUNDERS, MD as Consulting Physician (Oncology)  CHIEF COMPLAINTS/PURPOSE OF CONSULTATION: ANEMIA   HEMATOLOGY HISTORY  # ANEMIA[Hb; MCV-platelets- WBC; Iron sat; ferritin;  GFR- CT/US ; EGD/colonoscopy-  HISTORY OF PRESENTING ILLNESS: Discussed the use of AI scribe software for clinical note transcription with the patient, who gave verbal consent to proceed.  History of Present Illness   Alejandro Stuart is a 78 year old male with Hx early stage prostate cancer who presents with anemia. He is accompanied by his wife, who is a engineer, civil (consulting). He was referred for evaluation of anemia.  He underwent a total knee replacement surgery on July 01, 2024. Since the surgery, his hemoglobin levels have significantly declined from 12.5 g/dL in May 2025 to 8.4 g/dL as of last Thursday. He was previously on iron supplements due to low blood counts during his prostate cancer treatment, which included radiation therapy completed six months ago. Post-surgery, his appetite has been poor, leading to weight loss, although he is now eating more despite a lack of appetite.  His kidney function has been a concern, with his GFR dropping from 52 in April 2025 to 38 recently. He has a history of elevated alkaline phosphatase levels, which have been increasing over time, reaching 200 during his recent hospital stay. He has a history of alcohol use but quit drinking before his knee surgery.  He experienced a significant episode post-surgery where he was taken to the ER after feeling unwell during outpatient physical therapy. He had been on increased doses of pain medication, including Dilaudid and muscle relaxants, which may have contributed to his condition. He has since stopped using Dilaudid and muscle relaxants during  the day.  He has a history of low heart rate, with episodes of syncope in the past. He has not been treated for this condition but has a cardiology consult scheduled. No blood in urine or stool and has had a colonoscopy several years ago, followed by a Cologuard test in 2021, both of which were unremarkable.   He has a history of a possible blood clot in his leg, identified through recent ultrasound done in hospital.  He has been on aspirin 81 mg post-surgery but refused blood thinners due to the risk of bleeding associated with his low blood counts.He is monitoring for any new or worsening symptoms of swelling in the legs.      Colo>>> ?2021. No EGD.  Blood in stools: none Blood in urine:none Difficulty swallowing:none Change of bowel movement/constipation:none Prior blood transfusion: none Alcohol: vodak every night.  Bariatric surgery:none    Prior evaluation with hematology: none  Prior bone marrow biopsy: none  Oral iron: recent PO iron no issues.  Prior IV iron infusions: none     Review of Systems  Constitutional:  Positive for malaise/fatigue. Negative for chills, diaphoresis, fever and weight loss.  HENT:  Negative for nosebleeds and sore throat.   Eyes:  Negative for double vision.  Respiratory:  Negative for cough, hemoptysis, sputum production, shortness of breath and wheezing.   Cardiovascular:  Negative for chest pain, palpitations, orthopnea and leg swelling.  Gastrointestinal:  Negative for abdominal pain, blood in stool, constipation, diarrhea, heartburn, melena, nausea and vomiting.  Genitourinary:  Negative for dysuria, frequency and urgency.  Musculoskeletal:  Positive for joint pain. Negative for back pain.  Skin: Negative.  Negative  for itching and rash.  Neurological:  Negative for dizziness, tingling, focal weakness, weakness and headaches.  Endo/Heme/Allergies:  Does not bruise/bleed easily.  Psychiatric/Behavioral:  Negative for depression. The patient  is not nervous/anxious and does not have insomnia.      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 Mohs 05/10/2024   Sinus bradycardia     SURGICAL HISTORY: Past Surgical History:  Procedure Laterality Date   CATARACT EXTRACTION W/PHACO Right 03/30/2024   Procedure: PHACOEMULSIFICATION, CATARACT, WITH IOL INSERTION 10.61 01:00.1;  Surgeon: Jaye Fallow, MD;  Location: [facility] OR[facility];  Service: Ophthalmology;  Laterality: Right;   CATARACT EXTRACTION W/PHACO Left 04/06/2024   Procedure: PHACOEMULSIFICATION, CATARACT, WITH IOL INSERTION 8.17 00:57.8;  Surgeon: Jaye Fallow, MD;  Location: [facility];  Service: Ophthalmology;  Laterality: Left;   HIP SURGERY     KNEE ARTHROSCOPY     SHOULDER ARTHROSCOPY Bilateral     SOCIAL HISTORY: Social History   Socioeconomic History   Marital status: Married    Spouse name: Not on file   Number of children: 7   Years of education: Not on file   Highest education level: Bachelor's degree (e.g., BA, AB, BS)  Occupational History   Not on file  Tobacco Use   Smoking status: Former    Current packs/day: 0.00    Types: Cigarettes    Quit date: 09/16/1986    Years since quitting: 37.8    Passive exposure: Past   Smokeless tobacco: Never  Vaping Use   Vaping status: Never Used  Substance and Sexual Activity   Alcohol use: Not Currently    Alcohol/week: 7.0 standard drinks of alcohol    Types: 7 Shots of liquor per week   Drug use: Never   Sexual activity: Yes    Birth control/protection: None  Other Topics Concern   Not on file  Social History Narrative   Not on file   Social Drivers of Health   Financial Resource Strain: Low Risk  (05/31/2024)   Received from [facility]   Overall  Financial Resource Strain (CARDIA)    Difficulty of Paying Living Expenses: Not hard at all  Food Insecurity: No Food Insecurity (07/22/2024)   Hunger Vital Sign    Worried About Running Out of Food in the Last Year: Never true    Ran Out of Food in the Last Year: Never true  Transportation Needs: No Transportation Needs (07/22/2024)   PRAPARE - Administrator, Civil Service (Medical): No    Lack of Transportation (Non-Medical): No  Physical Activity: Not on file  Stress: Not on file  Social Connections: Not on file  Intimate Partner Violence: Not At Risk (07/22/2024)   Humiliation, Afraid, Rape, and Kick questionnaire    Fear of Current or Ex-Partner: No    Emotionally Abused: No    Physically Abused: No    Sexually Abused: No    FAMILY HISTORY: Family History  Problem Relation Age of Onset   Stroke Father    Prostate cancer Father     ALLERGIES:  is allergic to other and statins.  MEDICATIONS:  Current Outpatient Medications  Medication Sig Dispense Refill   amLODipine (NORVASC) 5 MG tablet Take 5 mg by  mouth daily.     aspirin EC 81 MG tablet Take 81 mg by mouth daily. Swallow whole.     Cyanocobalamin (VITAMIN B-12 PO) Take by mouth daily.     Docusate Calcium (STOOL SOFTENER PO) Take by mouth daily as needed.     losartan (COZAAR) 50 MG tablet Take 25 mg by mouth daily.     mirtazapine (REMERON) 7.5 MG tablet Take 7.5 mg by mouth at bedtime.     oxyCODONE (OXY IR/ROXICODONE) 5 MG immediate release tablet Take 5 mg by mouth every 4 (four) hours as needed.     tamsulosin  (FLOMAX ) 0.4 MG CAPS capsule Take 1 capsule (0.4 mg total) by mouth daily. 90 capsule 3   triamcinolone  cream (KENALOG ) 0.1 % Apply 1 Application topically 2 (two) times daily as needed. Avoid applying to face, groin, and axilla. Use as directed. Long-term use can cause thinning of the skin. 80 g 2   zolpidem (AMBIEN) 10 MG tablet Take 5 mg by mouth at bedtime. Take 1/2 tablet by mouth nightly      Calcium Carb-Cholecalciferol 500-10 MG-MCG TABS Take 1 tablet by mouth 2 (two) times daily with a meal. (Patient not taking: Reported on 07/22/2024)     sildenafil  (VIAGRA ) 50 MG tablet Take 1-2 tablets (50-100 mg total) by mouth as needed. Take 1-2 tablets on empty stomach 30-45 minutes prior to sexual activity (Patient not taking: Reported on 07/22/2024) 30 tablet 6   Vitamin E (VITAMIN E/D-ALPHA NATURAL) 268 MG (400 UNIT) CAPS Take 400 Units by mouth daily. (Patient not taking: Reported on 07/22/2024)     No current facility-administered medications for this visit.     PHYSICAL EXAMINATION:  Walking with walker; right knee swelling.   Vitals:   07/22/24 1044 07/22/24 1101  BP: (!) 141/77 121/79  Pulse: 68   Resp: 20   Temp: (!) 95.5 F (35.3 C)   SpO2: 100%    Filed Weights   07/22/24 1044  Weight: 182 lb (82.6 kg)    Physical Exam Vitals and nursing note reviewed.  HENT:     Head: Normocephalic and atraumatic.     Mouth/Throat:     Pharynx: Oropharynx is clear.  Eyes:     Extraocular Movements: Extraocular movements intact.     Pupils: Pupils are equal, round, and reactive to light.  Cardiovascular:     Rate and Rhythm: Normal rate and regular rhythm.  Pulmonary:     Comments: Decreased breath sounds bilaterally.  Abdominal:     Palpations: Abdomen is soft.  Musculoskeletal:        General: Normal range of motion.     Cervical back: Normal range of motion.  Skin:    General: Skin is warm.  Neurological:     General: No focal deficit present.     Mental Status: He is alert and oriented to person, place, and time.  Psychiatric:        Behavior: Behavior normal.        Judgment: Judgment normal.      LABORATORY DATA:  I have reviewed the data as listed Lab Results  Component Value Date   WBC 6.7 07/15/2024   HGB 8.4 (L) 07/15/2024   HCT 26.0 (L) 07/15/2024   MCV 95.2 07/15/2024   PLT 262 07/15/2024   Recent Labs    07/15/24 1621  NA 133*  K 4.6   CL 101  CO2 20*  GLUCOSE 165*  BUN 28*  CREATININE 1.82*  CALCIUM 9.1  GFRNONAA 38*  PROT 6.7  ALBUMIN 3.4*  AST 23  ALT 17  ALKPHOS 236*  BILITOT 1.2     CT Angio Chest PE W and/or Wo Contrast Result Date: 07/15/2024 EXAM: CTA of the Chest with contrast for PE 07/15/2024 07:13:01 PM TECHNIQUE: CTA of the chest was performed after the administration of intravenous contrast. Multiplanar reformatted images are provided for review. MIP images are provided for review. Automated exposure control, iterative reconstruction, and/or weight based adjustment of the mA/kV was utilized to reduce the radiation dose to as low as reasonably achievable. COMPARISON: None available. CLINICAL HISTORY: Pulmonary embolism (PE) suspected, high prob. FINDINGS: PULMONARY ARTERIES: Pulmonary arteries are adequately opacified for evaluation. Central pulmonary arteries are of normal caliber. No pulmonary embolism. MEDIASTINUM: Extensive coronary artery calcifications. Calcification of the aortic valve leaflets. Global cardiac size within normal limits. No pericardial effusion. Mild atherosclerotic calcification within the thoracic aorta. No aortic aneurysm. LYMPH NODES: No mediastinal, hilar or axillary lymphadenopathy. LUNGS AND PLEURA: Bibasilar dependent atelectasis. Bronchial wall thickening and scattered airway impaction best noted within the lung bases, noted in keeping with airway inflammation. No focal consolidation or pulmonary edema. No pneumothorax or pleural effusion. UPPER ABDOMEN: Limited images of the upper abdomen are unremarkable. SOFT TISSUES AND BONES: Osseous structures are age appropriate. No acute bone abnormality. No lytic or blastic bone lesion. No acute soft tissue abnormality. IMPRESSION: 1. No pulmonary embolism. 2. Bronchial wall thickening and scattered airway impaction, consistent with airway inflammation. 3. Extensive coronary artery calcifications. Calcification of the aortic valve leaflets.  Electronically signed by: Dorethia Molt MD 07/15/2024 07:24 PM EDT RP Workstation: [id number]   US  Venous Img Lower Bilateral Result Date: 07/15/2024 EXAM: ULTRASOUND DUPLEX OF THE BILATERAL LOWER EXTREMITY VEINS TECHNIQUE: Duplex ultrasound using B-mode/gray scaled imaging and Doppler spectral analysis and color flow was obtained of the deep venous structures of the bilateral lower extremity. CLINICAL HISTORY: Leg pain. COMPARISON: US  Right Lower Extremity Venous 07/06/2024. FINDINGS: RIGHT: Echodense foci are visualized within the mid femoral vein. The vein is incompletely compressible. The common femoral vein, popliteal vein, and posterior tibial vein of the right lower extremity demonstrate normal compressibility with normal color flow and spectral analysis. LEFT: The common femoral vein, femoral vein, popliteal vein, and posterior tibial vein of the left lower extremity demonstrate normal compressibility with normal color flow and spectral analysis. IMPRESSION: 1. Linear echogenic material within the mid right femoral vein which is incompletely compressible and there is luminal narrowing. The overall appearance favors chronic dvt, however, this is not clearly visualized on the ultrasound performed 9 days prior The deep venous system of the right lower extremity is otherwise patent and without evidence for thrombus. 2. Negative for left lower extremity acute dvt. Electronically signed by: Luke Bun MD 07/15/2024 06:28 PM EDT RP Workstation: [id number]   DG Chest 2 View Result Date: 07/15/2024 EXAM: 2 VIEW(S) XRAY OF THE CHEST 07/15/2024 06:18:00 PM COMPARISON: None available. CLINICAL HISTORY: hypotensive FINDINGS: LUNGS AND PLEURA: No focal pulmonary opacity. No pulmonary edema. No pleural effusion. No pneumothorax. HEART AND MEDIASTINUM: Atherosclerotic calcifications. No acute abnormality of the cardiac and mediastinal silhouettes. BONES AND SOFT TISSUES: No acute osseous abnormality. IMPRESSION:  1. No acute cardiopulmonary process. Electronically signed by: Michaeline Blanch MD 07/15/2024 06:25 PM EDT RP Workstation: [id number]   US  Venous Img Lower Unilateral Right (DVT) Result Date: 07/06/2024 CLINICAL DATA:  Right lower extremity edema. EXAM: RIGHT LOWER EXTREMITY VENOUS DOPPLER ULTRASOUND TECHNIQUE: Gray-scale sonography with graded compression, as well as color  Doppler and duplex ultrasound were performed to evaluate the lower extremity deep venous systems from the level of the common femoral vein and including the common femoral, femoral, profunda femoral, popliteal and calf veins including the posterior tibial, peroneal and gastrocnemius veins when visible. The superficial great saphenous vein was also interrogated. Spectral Doppler was utilized to evaluate flow at rest and with distal augmentation maneuvers in the common femoral, femoral and popliteal veins. COMPARISON:  None Available. FINDINGS: Contralateral Common Femoral Vein: Respiratory phasicity is normal and symmetric with the symptomatic side. No evidence of thrombus. Normal compressibility. Common Femoral Vein: No evidence of thrombus. Normal compressibility, respiratory phasicity and response to augmentation. Saphenofemoral Junction: No evidence of thrombus. Normal compressibility and flow on color Doppler imaging. Profunda Femoral Vein: No evidence of thrombus. Normal compressibility and flow on color Doppler imaging. Femoral Vein: No evidence of thrombus. Normal compressibility, respiratory phasicity and response to augmentation. Popliteal Vein: No evidence of thrombus. Normal compressibility, respiratory phasicity and response to augmentation. Calf Veins: No evidence of thrombus. Normal compressibility and flow on color Doppler imaging. Superficial Great Saphenous Vein: No evidence of thrombus. Normal compressibility. Venous Reflux:  None. Other Findings: Varicosity of the medial mid right calf appears to contain some nonocclusive  thrombus. No abnormal fluid collections. IMPRESSION: 1. No evidence of right lower extremity deep vein thrombosis. 2. Varicosity of the medial mid right calf appears to contain some nonocclusive thrombus consistent with superficial thrombophlebitis. Electronically Signed   By: Marcey Moan M.D.   On: 07/06/2024 16:17    ASSESSMENT & PLAN:   Symptomatic anemia # Anemia-hemoglobin 8.3 [October 2025] GFR 38-s/p surgery 3 weeks prior.  However hemoglobin April-May 2025-around 12.  Baseline GFR-40s to 50s  # I had a long discussion with the patient regarding etiology of unexplained anemia.  The etiologies include -benign like nutritional/malabsorption, liver disease, chronic kidney disease viral infections, autoimmune; blood loss etc. is quite possible the recent surgery  # Given the borderline anemia-given prior to surgery-discussed regarding possible need for bone marrow biopsy-possible malignancy.  But recommend further blood workup before consideration of any bone marrow biopsy.  I recommend CBC CMP LDH haptoglobin B12 folic acid reticulocyte count platelet immature fraction; review of peripheral smear.  Iron studies; ferritin.myeloma panel kappa lambda light chain ratio.  Given the borderline low hemoglobin check ABO/Rh; hold tube.   # Chronic femoral DVT [ultrasound October 2025-]-in the context of recent right knee surgery -discussed the risk of DVT given Limited mobility at the same time ongoing anemia.  Hold off any blood thinners given the ongoing anemia-although no obvious bleeding noted.  Continue aspirin.  Discussed the ER triggers  # Chronic mild/rising Alkaline phos- Normal- LFTs otherwise- await repeat labs. Recent CT chest- OCT 2025- neg for bone lesions.   Thank you Dr Lenon MD  for allowing me to participate in the care of your pleasant patient. Please do not hesitate to contact me with questions or concerns in the interim.  # DISPOSITION: # labs today- ordered  # follow up  TBD- Dr.B  # 45 minutes face-to-face with the patient discussing the above plan of care; more than 50% of time spent on counseling and coordination. My contact information was given; and all questions were answered. The patient knows to call the clinic with any problems, questions or concerns.    Cindy JONELLE Joe, MD 07/22/2024 12:00 PM

## 2024-07-22 NOTE — Progress Notes (Signed)
 Rt knee replacement 3 weeks. Has been sick since surgery, no appetite. Ensure every day. Possible blood clot in right leg. Wife concerned he needs to be on a blood thinner.

## 2024-07-23 LAB — ERYTHROPOIETIN: Erythropoietin: 8 m[IU]/mL (ref 2.6–18.5)

## 2024-07-24 LAB — HAPTOGLOBIN: Haptoglobin: 224 mg/dL (ref 34–355)

## 2024-07-27 LAB — MULTIPLE MYELOMA PANEL, SERUM
Albumin SerPl Elph-Mcnc: 3.7 g/dL (ref 2.9–4.4)
Albumin/Glob SerPl: 0.9 (ref 0.7–1.7)
Alpha 1: 0.4 g/dL (ref 0.0–0.4)
Alpha2 Glob SerPl Elph-Mcnc: 1.2 g/dL — ABNORMAL HIGH (ref 0.4–1.0)
B-Globulin SerPl Elph-Mcnc: 1.4 g/dL — ABNORMAL HIGH (ref 0.7–1.3)
Gamma Glob SerPl Elph-Mcnc: 1.1 g/dL (ref 0.4–1.8)
Globulin, Total: 4.2 g/dL — ABNORMAL HIGH (ref 2.2–3.9)
IgA: 351 mg/dL (ref 61–437)
IgG (Immunoglobin G), Serum: 1253 mg/dL (ref 603–1613)
IgM (Immunoglobulin M), Srm: 104 mg/dL (ref 15–143)
Total Protein ELP: 7.9 g/dL (ref 6.0–8.5)

## 2024-07-28 NOTE — Progress Notes (Signed)
 Cardiology Office Note   Date:  07/29/2024  ID:  Jatavion Peaster, DOB 1945/12/30, MRN [id number] PCP: Lenon Layman ORN, MD  [facility] Providers Cardiologist:  Caron Poser, MD     History of Present Illness Jelan Batterton is a 78 y.o. male PMH DDD, recent knee replacement on opiates, HTN, CKD 3B, mild aortic stenosis, history of reported SVT who presents for further evaluation management of near syncope.  Patient was seen in the ED for this issue on 07/15/2024.  Serial troponin negative, CMP at baseline, CBC with hemoglobin 8.4 in setting of recent surgery.  This is now increased to 11.4.  He had a DVT study showing a possible chronic DVT in the RLE.  A CTPA did not show any evidence of PE, but did show aortic valve calcifications and significant three-vessel CAC.  He has been following with heme-onc for the findings from the lower extremity ultrasound.  Last LDL 159 06/2023.  Patient reports he is overall doing well.  He has not had any more syncopal episodes since he was seen in the ED.  He does note at this time, he was on a significant amount of narcotics including opiates and muscle relaxers.  Relevant CVD History -CTPA 07/15/2024 three-vessel CAC and aortic valve calcifications -TTE 05/2022 normal biventricular function, mild TR, mild aortic stenosis   ROS: Pt denies any chest discomfort, jaw pain, arm pain, palpitations, syncope, presyncope, orthopnea, PND, or LE edema.  Studies Reviewed I have independently reviewed the patient's ECG, recent CT scan, previous medical records.  Physical Exam VS:  BP 126/74 (BP Location: Right Arm, Patient Position: Sitting, Cuff Size: Normal)   Pulse 80   Ht 6' (1.829 m)   Wt 185 lb (83.9 kg)   SpO2 98%   BMI 25.09 kg/m   Orthostatic VS for the past 24 hrs (Last 3 readings):  BP- Lying Pulse- Lying BP- Sitting Pulse- Sitting BP- Standing at 0 minutes Pulse- Standing at 0 minutes BP- Standing at 3 minutes Pulse- Standing  at 3 minutes  07/29/24 0932 157/76 60 138/69 62 128/75 69 131/75 72      Wt Readings from Last 3 Encounters:  07/29/24 185 lb (83.9 kg)  07/22/24 182 lb (82.6 kg)  07/21/24 190 lb (86.2 kg)    GEN: No acute distress. NECK: No JVD; No carotid bruits. CARDIAC: RRR, no murmurs, rubs, gallops. RESPIRATORY:  Clear to auscultation. EXTREMITIES:  Warm and well-perfused. No edema.  ASSESSMENT AND PLAN Near syncope History of SVT History of mild aortic stenosis Remains undifferentiated.  He has fortunately not had any more episodes since his ED visit.  At the time, he was on a significant amount of narcotics including opiates and muscle relaxers.  Orthostatic vital signs in office were normal.  He does have some bradycardia which has been going on for a long time.  He also has reports of SVT.  Mild aortic stenosis was seen on an echo a couple of years ago.  Further testing is indicated.  Plan: - Repeat echocardiogram to assess progression of aortic stenosis or other causes for syncope - Monitor to evaluate for tachy or bradycardia arrhythmic contribution - Since this is resolved with cessation of narcotics, this could just be due to polypharmacy.  This could also represent bradycardia arrhythmias or tachybradycardia syndrome.  I discussed obtaining a ETT to assess for chronotropic incompetence, this will need to wait a bit until more time has elapsed from his recent knee surgery.  We will hold off  on an EP referral for now until we have more information.  CAC HLD Statin intolerance Severe three-vessel coronary calcium on recent CT scan.  He has no chest discomfort or other symptoms compatible with angina.  Last LDL uncontrolled 159 06/2023.  He has a history of statin intolerance which caused LFT elevations.  Discussed Repatha which he is amenable to.  Plan: - Continue ASA 81 mg daily - Start Repatha; goal less than 70 - Will check lipids at next visit - No ischemic workup indicated for now  unless he develops symptoms; I asked he to reach out to me should this occur        Dispo: RTC 6 months or sooner as needed  Signed, Caron Poser, MD

## 2024-07-29 ENCOUNTER — Telehealth: Payer: Self-pay | Admitting: Pharmacy Technician

## 2024-07-29 ENCOUNTER — Other Ambulatory Visit (HOSPITAL_COMMUNITY): Payer: Self-pay

## 2024-07-29 ENCOUNTER — Ambulatory Visit

## 2024-07-29 ENCOUNTER — Ambulatory Visit (INDEPENDENT_AMBULATORY_CARE_PROVIDER_SITE_OTHER)

## 2024-07-29 VITALS — BP 126/74 | HR 80 | Ht 72.0 in | Wt 185.0 lb

## 2024-07-29 DIAGNOSIS — R55 Syncope and collapse: Secondary | ICD-10-CM

## 2024-07-29 DIAGNOSIS — E782 Mixed hyperlipidemia: Secondary | ICD-10-CM

## 2024-07-29 DIAGNOSIS — I471 Supraventricular tachycardia, unspecified: Secondary | ICD-10-CM | POA: Diagnosis not present

## 2024-07-29 DIAGNOSIS — I350 Nonrheumatic aortic (valve) stenosis: Secondary | ICD-10-CM

## 2024-07-29 DIAGNOSIS — Z789 Other specified health status: Secondary | ICD-10-CM

## 2024-07-29 DIAGNOSIS — I2510 Atherosclerotic heart disease of native coronary artery without angina pectoris: Secondary | ICD-10-CM | POA: Diagnosis not present

## 2024-07-29 MED ORDER — REPATHA SURECLICK 140 MG/ML ~~LOC~~ SOAJ: 140.0000 mg | SUBCUTANEOUS | 6 refills | Status: AC

## 2024-07-29 NOTE — Telephone Encounter (Signed)
 Pharmacy Patient Advocate Encounter  Received notification from [facility] that Prior Authorization for REPATHA has been APPROVED from 07/29/24 to 09/15/24   PA #/Case ID/Reference #: [id number]

## 2024-07-29 NOTE — Telephone Encounter (Signed)
   Pharmacy Patient Advocate Encounter   Received notification from CoverMyMeds that prior authorization for repatha is required/requested.   Insurance verification completed.   The patient is insured through [employer] [facility].   Per test claim: PA required; PA submitted to above mentioned insurance via Latent Key/confirmation #/EOC [id number] Status is pending

## 2024-07-29 NOTE — Patient Instructions (Addendum)
 Medication Instructions:  Your physician recommends the following medication changes.  START TAKING: Repatha 140 mg subcutaneous injection every 14 days (rotate sites)  Continue all other medications as prescribed.  *If you need a refill on your cardiac medications before your next appointment, please call your pharmacy*  Lab Work: No labs ordered today  If you have labs (blood work) drawn today and your tests are completely normal, you will receive your results only by: MyChart Message (if you have MyChart) OR A paper copy in the mail If you have any lab test that is abnormal or we need to change your treatment, we will call you to review the results.  Testing/Procedures: Your physician has requested that you have an echocardiogram. Echocardiography is a painless test that uses sound waves to create images of your heart. It provides your doctor with information about the size and shape of your heart and how well your heart's chambers and valves are working.   You may receive an ultrasound enhancing agent through an IV if needed to better visualize your heart during the echo. This procedure takes approximately one hour.  There are no restrictions for this procedure.  This will take place at [address] [facility]) [address], [place] [address]  Please note: We ask at that you not bring children with you during ultrasound (echo/ vascular) testing. Due to room size and safety concerns, children are not allowed in the ultrasound rooms during exams. Our front office staff cannot provide observation of children in our lobby area while testing is being conducted. An adult accompanying a patient to their appointment will only be allowed in the ultrasound room at the discretion of the ultrasound technician under special circumstances. We apologize for any inconvenience.     ZIO XT- Long Term Monitor Instructions  Your physician has requested you wear a ZIO patch monitor for 14  days.  This is a single patch monitor. [employer] supplies one patch monitor per enrollment. Additional stickers are not available. Please do not apply patch if you will be having a Nuclear Stress Test, Echocardiogram, Cardiac CT, MRI, or Chest Xray during the period you would be wearing the monitor. The patch cannot be worn during these tests. You cannot remove and re-apply the ZIO XT patch monitor.  Your ZIO patch monitor will be mailed 3 day USPS to your address on file. It may take 3-5 days to receive your monitor after you have been enrolled. Once you have received your monitor, please review the enclosed instructions. Your monitor has already been registered assigning a specific monitor serial number to you.  Billing and Patient Assistance Program Information  We have supplied [employer] with any of your insurance information on file for billing purposes.  [employer] offers a sliding scale Patient Assistance Program for patients that do not have insurance, or whose insurance does not completely cover the cost of the ZIO monitor.  You must apply for the Patient Assistance Program to qualify for this discounted rate.  To apply, please call [employer] at [phone], select option 4, select option 2, ask to apply for Patient Assistance Program. Meredeth will ask your household income, and how many people are in your household. They will quote your out-of-pocket cost based on that information. [employer] will also be able to set up a 47-month, interest-free payment plan if needed.   After Applying Monitor:  Do not shower for the first 24 hours. You may shower after the first 24 hours. (Keep back turned toward  water)  Press the button if you feel a symptom. You will hear a small click. Record Date, Time and Symptom in the Patient Logbook.   After Completing 14 Days: When you are ready to remove the patch, follow instructions on the last 2 pages of Patient Logbook.  Stick patch monitor into the tabs at the bottom  of the return box.  Place Patient Logbook in the blue and white box. Use locking tab on box and tape box closed securely. The blue and white box has prepaid postage on it. Please place it in the mailbox as soon as possible. Your physician should have your test results approximately 7-14 days after the monitor has been mailed back to [facility].   Troubleshooting: Call [facility] at [phone] if you have questions regarding your ZIO XT patch monitor.  Call them immediately if you see an orange light blinking on your monitor.  If your monitor falls off in less than 4 days, contact our Monitor department at [phone].  If your monitor becomes loose or falls off after 4 days call [employer] at [phone] for suggestions on securing your monitor.   Follow-Up: At [facility], you and your health needs are our priority.  As part of our continuing mission to provide you with exceptional heart care, our providers are all part of one team.  This team includes your primary Cardiologist (physician) and Advanced Practice Providers or APPs (Physician Assistants and Nurse Practitioners) who all work together to provide you with the care you need, when you need it.  Your next appointment:  6 month(s)  Provider:  Caron Poser, MD    We recommend signing up for the patient portal called MyChart.  Sign up information is provided on this After Visit Summary.  MyChart is used to connect with patients for Virtual Visits (Telemedicine).  Patients are able to view lab/test results, encounter notes, upcoming appointments, etc.  Non-urgent messages can be sent to your provider as well.   To learn more about what you can do with MyChart, go to [email or web address].

## 2024-08-04 ENCOUNTER — Other Ambulatory Visit: Payer: Self-pay

## 2024-08-04 DIAGNOSIS — D649 Anemia, unspecified: Secondary | ICD-10-CM

## 2024-08-04 NOTE — Progress Notes (Signed)
 Labs entered.

## 2024-08-05 ENCOUNTER — Ambulatory Visit

## 2024-08-09 ENCOUNTER — Encounter: Payer: Self-pay | Admitting: Dermatology

## 2024-08-09 ENCOUNTER — Ambulatory Visit: Admitting: Dermatology

## 2024-08-09 DIAGNOSIS — L570 Actinic keratosis: Secondary | ICD-10-CM | POA: Diagnosis not present

## 2024-08-09 DIAGNOSIS — Z86007 Personal history of in-situ neoplasm of skin: Secondary | ICD-10-CM

## 2024-08-09 DIAGNOSIS — D492 Neoplasm of unspecified behavior of bone, soft tissue, and skin: Secondary | ICD-10-CM | POA: Diagnosis not present

## 2024-08-09 DIAGNOSIS — L2081 Atopic neurodermatitis: Secondary | ICD-10-CM | POA: Diagnosis not present

## 2024-08-09 DIAGNOSIS — L578 Other skin changes due to chronic exposure to nonionizing radiation: Secondary | ICD-10-CM | POA: Diagnosis not present

## 2024-08-09 DIAGNOSIS — C44622 Squamous cell carcinoma of skin of right upper limb, including shoulder: Secondary | ICD-10-CM

## 2024-08-09 DIAGNOSIS — Z1283 Encounter for screening for malignant neoplasm of skin: Secondary | ICD-10-CM | POA: Diagnosis not present

## 2024-08-09 DIAGNOSIS — W908XXA Exposure to other nonionizing radiation, initial encounter: Secondary | ICD-10-CM

## 2024-08-09 DIAGNOSIS — L814 Other melanin hyperpigmentation: Secondary | ICD-10-CM

## 2024-08-09 DIAGNOSIS — D229 Melanocytic nevi, unspecified: Secondary | ICD-10-CM

## 2024-08-09 DIAGNOSIS — C4492 Squamous cell carcinoma of skin, unspecified: Secondary | ICD-10-CM

## 2024-08-09 DIAGNOSIS — D1801 Hemangioma of skin and subcutaneous tissue: Secondary | ICD-10-CM

## 2024-08-09 DIAGNOSIS — L821 Other seborrheic keratosis: Secondary | ICD-10-CM

## 2024-08-09 HISTORY — DX: Squamous cell carcinoma of skin, unspecified: C44.92

## 2024-08-09 MED ORDER — TRIAMCINOLONE ACETONIDE 0.1 % EX CREA: 1.0000 | TOPICAL_CREAM | Freq: Two times a day (BID) | CUTANEOUS | 2 refills | Status: AC | PRN

## 2024-08-09 NOTE — Patient Instructions (Addendum)
 Cryotherapy Aftercare  Wash gently with soap and water everyday.   Apply Vaseline Jelly daily until healed.    Wound Care Instructions  Cleanse wound gently with soap and water once a day then pat dry with clean gauze. Apply a thin coat of Petrolatum (petroleum jelly, Vaseline) over the wound (unless you have an allergy to this). We recommend that you use a new, sterile tube of Vaseline. Do not pick or remove scabs. Do not remove the yellow or white healing tissue from the base of the wound.  Cover the wound with fresh, clean, nonstick gauze and secure with paper tape. You may use Band-Aids in place of gauze and tape if the wound is small enough, but would recommend trimming much of the tape off as there is often too much. Sometimes Band-Aids can irritate the skin.  You should call the office for your biopsy report after 1 week if you have not already been contacted.  If you experience any problems, such as abnormal amounts of bleeding, swelling, significant bruising, significant pain, or evidence of infection, please call the office immediately.  FOR ADULT SURGERY PATIENTS: If you need something for pain relief you may take 1 extra strength Tylenol  (acetaminophen ) AND 2 Ibuprofen (200mg  each) together every 4 hours as needed for pain. (do not take these if you are allergic to them or if you have a reason you should not take them.) Typically, you may only need pain medication for 1 to 3 days.      Recommend daily broad spectrum sunscreen SPF 30+ to sun-exposed areas, reapply every 2 hours as needed. Call for new or changing lesions.  Staying in the shade or wearing long sleeves, sun glasses (UVA+UVB protection) and wide brim hats (4-inch brim around the entire circumference of the hat) are also recommended for sun protection.      Melanoma ABCDEs  Melanoma is the most dangerous type of skin cancer, and is the leading cause of death from skin disease.  You are more likely to develop  melanoma if you: Have light-colored skin, light-colored eyes, or red or blond hair Spend a lot of time in the sun Tan regularly, either outdoors or in a tanning bed Have had blistering sunburns, especially during childhood Have a close family member who has had a melanoma Have atypical moles or large birthmarks  Early detection of melanoma is key since treatment is typically straightforward and cure rates are extremely high if we catch it early.   The first sign of melanoma is often a change in a mole or a new dark spot.  The ABCDE system is a way of remembering the signs of melanoma.  A for asymmetry:  The two halves do not match. B for border:  The edges of the growth are irregular. C for color:  A mixture of colors are present instead of an even brown color. D for diameter:  Melanomas are usually (but not always) greater than 6mm - the size of a pencil eraser. E for evolution:  The spot keeps changing in size, shape, and color.  Please check your skin once per month between visits. You can use a small mirror in front and a large mirror behind you to keep an eye on the back side or your body.   If you see any new or changing lesions before your next follow-up, please call to schedule a visit.  Please continue daily skin protection including broad spectrum sunscreen SPF 30+ to sun-exposed areas, reapplying every  2 hours as needed when you're outdoors.   Staying in the shade or wearing long sleeves, sun glasses (UVA+UVB protection) and wide brim hats (4-inch brim around the entire circumference of the hat) are also recommended for sun protection.      Due to recent changes in healthcare laws, you may see results of your pathology and/or laboratory studies on MyChart before the doctors have had a chance to review them. We understand that in some cases there may be results that are confusing or concerning to you. Please understand that not all results are received at the same time and often  the doctors may need to interpret multiple results in order to provide you with the best plan of care or course of treatment. Therefore, we ask that you please give us  2 business days to thoroughly review all your results before contacting the office for clarification. Should we see a critical lab result, you will be contacted sooner.   If You Need Anything After Your Visit  If you have any questions or concerns for your doctor, please call our main line at [phone] and press option 4 to reach your doctor's medical assistant. If no one answers, please leave a voicemail as directed and we will return your call as soon as possible. Messages left after 4 pm will be answered the following business day.   You may also send us  a message via MyChart. We typically respond to MyChart messages within 1-2 business days.  For prescription refills, please ask your pharmacy to contact our office. Our fax number is [phone].  If you have an urgent issue when the clinic is closed that cannot wait until the next business day, you can page your doctor at the number below.    Please note that while we do our best to be available for urgent issues outside of office hours, we are not available 24/7.   If you have an urgent issue and are unable to reach us , you may choose to seek medical care at your doctor's office, retail clinic, urgent care center, or emergency room.  If you have a medical emergency, please immediately call 911 or go to the emergency department.  Pager Numbers  - Dr. Hester: [phone]  - Dr. Jackquline: [phone]  - Dr. Claudene: [phone]   - Dr. Raymund: [phone]  In the event of inclement weather, please call our main line at [phone] for an update on the status of any delays or closures.  Dermatology Medication Tips: Please keep the boxes that topical medications come in in order to help keep track of the instructions about where and how to use these. Pharmacies  typically print the medication instructions only on the boxes and not directly on the medication tubes.   If your medication is too expensive, please contact our office at [phone] option 4 or send us  a message through MyChart.   We are unable to tell what your co-pay for medications will be in advance as this is different depending on your insurance coverage. However, we may be able to find a substitute medication at lower cost or fill out paperwork to get insurance to cover a needed medication.   If a prior authorization is required to get your medication covered by your insurance company, please allow us  1-2 business days to complete this process.  Drug prices often vary depending on where the prescription is filled and some pharmacies may offer cheaper prices.  The website www.goodrx.com contains coupons for medications through  different pharmacies. The prices here do not account for what the cost may be with help from insurance (it may be cheaper with your insurance), but the website can give you the price if you did not use any insurance.  - You can print the associated coupon and take it with your prescription to the pharmacy.  - You may also stop by our office during regular business hours and pick up a GoodRx coupon card.  - If you need your prescription sent electronically to a different pharmacy, notify our office through [facility] or by phone at [phone] option 4.     Si Usted Necesita Algo Despus de Su Visita  Tambin puede enviarnos un mensaje a travs de Clinical cytogeneticist. Por lo general respondemos a los mensajes de MyChart en el transcurso de 1 a 2 das hbiles.  Para renovar recetas, por favor pida a su farmacia que se ponga en contacto con nuestra oficina. Randi lakes de fax es [place] [phone].  Si tiene un asunto urgente cuando la clnica est cerrada y que no puede esperar hasta el siguiente da hbil, puede llamar/localizar a su doctor(a) al nmero que  aparece a continuacin.   Por favor, tenga en cuenta que aunque hacemos todo lo posible para estar disponibles para asuntos urgentes fuera del horario de [place], no estamos disponibles las 24 horas del da, los 7 [address]  [address] de la [place].   Si tiene un problema urgente y no puede comunicarse con nosotros, puede optar por buscar atencin mdica  en el consultorio de su doctor(a), en una clnica privada, en un centro de atencin urgente o en una sala de emergencias.  Si tiene Engineer, drilling, por favor llame inmediatamente al 911 o vaya a la sala de emergencias.  Nmeros de bper  - Dr. Hester: [phone]  - Dra. Jackquline: [phone]  - Dr. Claudene: [phone]  - Dra. Kitts: [phone]  En caso de inclemencias del [place], por favor llame a nuestra lnea principal al [phone] para una actualizacin sobre el estado de cualquier retraso o cierre.  Consejos para la medicacin en dermatologa: Por favor, guarde las cajas en las que vienen los medicamentos de uso tpico para ayudarle a seguir las instrucciones sobre dnde y cmo usarlos. Las farmacias generalmente imprimen las instrucciones del medicamento slo en las cajas y no directamente en los tubos del [place].   Si su medicamento es muy caro, por favor, pngase en contacto con landry rieger llamando al [phone] y presione la opcin 4 o envenos un mensaje a travs de Clinical cytogeneticist.   No podemos decirle cul ser su copago por los medicamentos por adelantado ya que esto es diferente dependiendo de la cobertura de su seguro. Sin embargo, es posible que podamos encontrar un medicamento sustituto a Audiological scientist un formulario para que el seguro cubra el medicamento que se considera necesario.   Si se requiere una autorizacin previa para que su compaa de seguros malta su medicamento, por favor permtanos de 1 a 2 das hbiles para completar este proceso.  Los precios de los medicamentos varan con frecuencia  dependiendo del Environmental consultant de dnde se surte la receta y alguna farmacias pueden ofrecer precios ms baratos.  El sitio web www.goodrx.com tiene cupones para medicamentos de Health and safety inspector. Los precios aqu no tienen en cuenta lo que podra costar con la ayuda del seguro (puede ser ms barato con su seguro), pero el sitio web puede darle el precio si no utiliz Tourist information centre manager.  - Puede imprimir el  cupn correspondiente y llevarlo con su receta a la farmacia.  - Tambin puede pasar por nuestra oficina durante el horario de atencin regular y Education officer, museum una tarjeta de cupones de GoodRx.  - Si necesita que su receta se enve electrnicamente a una farmacia diferente, informe a nuestra oficina a travs de MyChart de [place] o por telfono llamando al [phone] y presione la opcin 4.

## 2024-08-09 NOTE — Progress Notes (Signed)
 Follow-Up Visit   Subjective  Alejandro Stuart is a 78 y.o. male who presents for the following: Skin Cancer Screening and Full Body Skin Exam. Hx of SCC. Check spots on forehead and right upper back.   The patient presents for Total-Body Skin Exam (TBSE) for skin cancer screening and mole check. The patient has spots, moles and lesions to be evaluated, some may be new or changing and the patient may have concern these could be cancer.    The following portions of the chart were reviewed this encounter and updated as appropriate: medications, allergies, medical history  Review of Systems:  No other skin or systemic complaints except as noted in HPI or Assessment and Plan.  Objective  Well appearing patient in no apparent distress; mood and affect are within normal limits.  A full examination was performed including scalp, head, eyes, ears, nose, lips, neck, chest, axillae, abdomen, back, buttocks, bilateral upper extremities, bilateral lower extremities, hands, feet, fingers, toes, fingernails, and toenails. All findings within normal limits unless otherwise noted below.   Relevant physical exam findings are noted in the Assessment and Plan.  L forehead x1, L ear mid helix x1, scalp x4 (6) Erythematous thin papules/macules with gritty scale.  Right posterior superior shoulder 6 mm pink keratotic papule   Assessment & Plan   SKIN CANCER SCREENING PERFORMED TODAY.  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 Mohs 05/10/2024. - No evidence of recurrence today - Recommend regular full body skin exams - Recommend daily broad spectrum sunscreen SPF 30+ to sun-exposed areas, reapply every 2 hours as needed.  - Call if any new or changing lesions are noted between office visits   ACTINIC DAMAGE - Chronic condition, secondary to cumulative UV/sun exposure - diffuse scaly erythematous macules with underlying dyspigmentation - Recommend daily broad spectrum  sunscreen SPF 30+ to sun-exposed areas, reapply every 2 hours as needed.  - Staying in the shade or wearing long sleeves, sun glasses (UVA+UVB protection) and wide brim hats (4-inch brim around the entire circumference of the hat) are also recommended for sun protection.  - Call for new or changing lesions.  LENTIGINES, SEBORRHEIC KERATOSES, HEMANGIOMAS - Benign normal skin lesions - Benign-appearing - Call for any changes  MELANOCYTIC NEVI - Tan-brown and/or pink-flesh-colored symmetric macules and papules - Benign appearing on exam today - Observation - Call clinic for new or changing moles - Recommend daily use of broad spectrum spf 30+ sunscreen to sun-exposed areas.    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 Scattered excoriations on trunk and extremities 30% BSA  Chronic and persistent condition with duration or expected duration over one year. Condition is bothersome/symptomatic for patient. Currently flared.   Atopic dermatitis (eczema) is a chronic, relapsing, pruritic condition that can significantly affect quality of life. It is often associated with allergic rhinitis and/or asthma and can require treatment with topical medications, phototherapy, or in severe cases biologic injectable medication (Dupixent; Adbry) or Oral JAK inhibitors.  Treatment Plan: Triamcinolone  cream BID until clear  Recommend gentle skin care.   AK (ACTINIC KERATOSIS) (6) L forehead x1, L ear mid helix x1, scalp x4 (6) Actinic keratoses are precancerous spots that appear secondary to cumulative UV radiation exposure/sun exposure over time. They are chronic with expected duration over 1 year. A portion of actinic keratoses will progress to squamous cell carcinoma of the skin. It is not possible to reliably predict which spots will progress to  skin cancer and so treatment is recommended to prevent development of skin cancer.  Recommend daily broad spectrum sunscreen  SPF 30+ to sun-exposed areas, reapply every 2 hours as needed.  Recommend staying in the shade or wearing long sleeves, sun glasses (UVA+UVB protection) and wide brim hats (4-inch brim around the entire circumference of the hat). Call for new or changing lesions. Destruction of lesion - L forehead x1, L ear mid helix x1, scalp x4 (6) Complexity: simple   Destruction method: cryotherapy   Informed consent: discussed and consent obtained   Timeout:  patient name, date of birth, surgical site, and procedure verified Lesion destroyed using liquid nitrogen: Yes   Region frozen until ice ball extended beyond lesion: Yes   Cryo cycles: 1 or 2. Outcome: patient tolerated procedure well with no complications   Post-procedure details: wound care instructions given   Additional details:  Prior to procedure, discussed risks of blister formation, small wound, skin dyspigmentation, or rare scar following cryotherapy. Recommend Vaseline ointment to treated areas while healing.   NEOPLASM OF SKIN Right posterior superior shoulder Epidermal / dermal shaving  Lesion diameter (cm):  0.6 Informed consent: discussed and consent obtained   Timeout: patient name, date of birth, surgical site, and procedure verified   Procedure prep:  Patient was prepped and draped in usual sterile fashion Prep type:  Isopropyl alcohol Anesthesia: the lesion was anesthetized in a standard fashion   Anesthetic:  1% lidocaine  w/ epinephrine  1-100,000 buffered w/ 8.4% NaHCO3 Instrument used: DermaBlade   Hemostasis achieved with: pressure and aluminum chloride   Outcome: patient tolerated procedure well   Post-procedure details: wound care instructions given    Specimen 1 - Surgical pathology Differential Diagnosis: R/O SCC  Check Margins: No ATOPIC NEURODERMATITIS   Related Medications triamcinolone  cream (KENALOG ) 0.1 % Apply 1 Application topically 2 (two) times daily as needed. Avoid applying to face, groin, and  axilla. Use as directed. Long-term use can cause thinning of the skin. MULTIPLE BENIGN NEVI   LENTIGINES   ACTINIC ELASTOSIS   SEBORRHEIC KERATOSES   CHERRY ANGIOMA   Return in about 1 year (around 08/09/2025) for TBSE.  I, Kate Fought, CMA, am acting as scribe for Boneta Sharps, MD.   Documentation: I have reviewed the above documentation for accuracy and completeness, and I agree with the above.  Boneta Sharps, MD

## 2024-08-11 LAB — SURGICAL PATHOLOGY

## 2024-08-12 ENCOUNTER — Ambulatory Visit: Payer: Self-pay | Admitting: Dermatology

## 2024-08-16 NOTE — Telephone Encounter (Signed)
 Called patient. N/A. LMOVM to return call for results and treatment options.

## 2024-08-26 ENCOUNTER — Ambulatory Visit: Payer: Self-pay

## 2024-08-26 DIAGNOSIS — R55 Syncope and collapse: Secondary | ICD-10-CM | POA: Diagnosis not present

## 2024-08-26 DIAGNOSIS — I471 Supraventricular tachycardia, unspecified: Secondary | ICD-10-CM | POA: Diagnosis not present

## 2024-08-30 ENCOUNTER — Encounter: Payer: Self-pay | Admitting: Dermatology

## 2024-08-30 NOTE — Telephone Encounter (Signed)
-----   Message from Boneta Sharps, MD sent at 08/12/2024  8:44 AM EST ----- Diagnosis: right posterior superior shoulder :       WELL DIFFERENTIATED SQUAMOUS CELL CARCINOMA, CLOSE TO MARGIN   Please call to share diagnosis and discuss treatment options. Please message me with patient's choice and schedule ED&C or excision  Explanation: Biopsy shows a squamous cell skin cancer that has grown beyond the surface of the skin and is invading the second layer of the skin. It has the potential to spread beyond the skin and  threaten your health, so I recommend treating it.  Treatment option 1: you return for a 15 minute appointment where we perform electrodesiccation and curettage [facility]). This involves three rounds of scraping and burning to destroy the skin cancer. It  has about an 75-85% cure rate and leaves a round wound slightly larger than the skin cancer and leaves a round white scar. No additional pathology is done. If the skin cancer comes back, we would  need to do a surgery to remove it.   Treatment option 2: you return for an hour long appointment where we perform a skin surgery. We numb the site of the skin cancer and a safety margin of normal skin around it. We remove the full  thickness of skin and close the wound with two layers of stitches. The sample is sent to the lab to check that the skin cancer was fully removed. Return one week later to have wound checked and  surface stitches removed. Surgical wound leaves a line scar. Approximately 95% cure rate. Risk of recurrence, bleeding, infection, pain, injury to nearby structures, hypertrophic scar. ----- Message ----- From: Interface, Lab In Three Zero One Sent: 08/11/2024   3:52 PM EST To: Boneta Sharps, MD

## 2024-08-30 NOTE — Telephone Encounter (Signed)
 Spoke with patient and advised of results. Patient would like to think about treatment options and discuss with his wife. Patient states he will call back tomorrow with treatment decision.

## 2024-09-01 ENCOUNTER — Encounter: Payer: Self-pay | Admitting: *Deleted

## 2024-09-06 ENCOUNTER — Ambulatory Visit

## 2024-09-14 NOTE — Telephone Encounter (Signed)
-----   Message from Boneta Sharps, MD sent at 08/12/2024  8:44 AM EST ----- Diagnosis: right posterior superior shoulder :       WELL DIFFERENTIATED SQUAMOUS CELL CARCINOMA, CLOSE TO MARGIN   Please call to share diagnosis and discuss treatment options. Please message me with patient's choice and schedule ED&C or excision  Explanation: Biopsy shows a squamous cell skin cancer that has grown beyond the surface of the skin and is invading the second layer of the skin. It has the potential to spread beyond the skin and  threaten your health, so I recommend treating it.  Treatment option 1: you return for a 15 minute appointment where we perform electrodesiccation and curettage [facility]). This involves three rounds of scraping and burning to destroy the skin cancer. It  has about an 75-85% cure rate and leaves a round wound slightly larger than the skin cancer and leaves a round white scar. No additional pathology is done. If the skin cancer comes back, we would  need to do a surgery to remove it.   Treatment option 2: you return for an hour long appointment where we perform a skin surgery. We numb the site of the skin cancer and a safety margin of normal skin around it. We remove the full  thickness of skin and close the wound with two layers of stitches. The sample is sent to the lab to check that the skin cancer was fully removed. Return one week later to have wound checked and  surface stitches removed. Surgical wound leaves a line scar. Approximately 95% cure rate. Risk of recurrence, bleeding, infection, pain, injury to nearby structures, hypertrophic scar. ----- Message ----- From: Interface, Lab In Three Zero One Sent: 08/11/2024   3:52 PM EST To: Boneta Sharps, MD

## 2024-10-13 NOTE — Telephone Encounter (Signed)
 Letter mailed out for patient to call us  back with his treatment of choice.

## 2024-10-13 NOTE — Telephone Encounter (Signed)
-----   Message from Boneta Sharps, MD sent at 08/12/2024  8:44 AM EST ----- Diagnosis: right posterior superior shoulder :       WELL DIFFERENTIATED SQUAMOUS CELL CARCINOMA, CLOSE TO MARGIN   Please call to share diagnosis and discuss treatment options. Please message me with patient's choice and schedule ED&C or excision  Explanation: Biopsy shows a squamous cell skin cancer that has grown beyond the surface of the skin and is invading the second layer of the skin. It has the potential to spread beyond the skin and  threaten your health, so I recommend treating it.  Treatment option 1: you return for a 15 minute appointment where we perform electrodesiccation and curettage [facility]). This involves three rounds of scraping and burning to destroy the skin cancer. It  has about an 75-85% cure rate and leaves a round wound slightly larger than the skin cancer and leaves a round white scar. No additional pathology is done. If the skin cancer comes back, we would  need to do a surgery to remove it.   Treatment option 2: you return for an hour long appointment where we perform a skin surgery. We numb the site of the skin cancer and a safety margin of normal skin around it. We remove the full  thickness of skin and close the wound with two layers of stitches. The sample is sent to the lab to check that the skin cancer was fully removed. Return one week later to have wound checked and  surface stitches removed. Surgical wound leaves a line scar. Approximately 95% cure rate. Risk of recurrence, bleeding, infection, pain, injury to nearby structures, hypertrophic scar. ----- Message ----- From: Interface, Lab In Three Zero One Sent: 08/11/2024   3:52 PM EST To: Boneta Sharps, MD

## 2024-10-29 ENCOUNTER — Other Ambulatory Visit

## 2024-11-03 ENCOUNTER — Ambulatory Visit: Admitting: Urology

## 2025-01-19 ENCOUNTER — Inpatient Hospital Stay: Admitting: Internal Medicine

## 2025-01-19 ENCOUNTER — Inpatient Hospital Stay

## 2025-01-26 ENCOUNTER — Ambulatory Visit: Payer: Self-pay | Admitting: Radiation Oncology

## 2025-08-15 ENCOUNTER — Ambulatory Visit: Admitting: Dermatology
